# Patient Record
Sex: Female | Born: 1975 | Race: White | Hispanic: No | Marital: Single | Smoking: Former smoker
Health system: Southern US, Community
[De-identification: ages and names within clinical notes are randomized; demographics above are authoritative.]

## PROBLEM LIST (undated history)

## (undated) DIAGNOSIS — R000 Tachycardia, unspecified: Secondary | ICD-10-CM

## (undated) DIAGNOSIS — R0789 Other chest pain: Secondary | ICD-10-CM

## (undated) DIAGNOSIS — G43109 Migraine with aura, not intractable, without status migrainosus: Secondary | ICD-10-CM

## (undated) DIAGNOSIS — F419 Anxiety disorder, unspecified: Secondary | ICD-10-CM

## (undated) DIAGNOSIS — F4310 Post-traumatic stress disorder, unspecified: Secondary | ICD-10-CM

## (undated) DIAGNOSIS — G2581 Restless legs syndrome: Secondary | ICD-10-CM

## (undated) HISTORY — DX: Restless legs syndrome: G25.81

## (undated) HISTORY — DX: Migraine with aura, not intractable, without status migrainosus: G43.109

## (undated) HISTORY — DX: Post-traumatic stress disorder, unspecified: F43.10

## (undated) HISTORY — DX: Tachycardia, unspecified: R00.0

## (undated) HISTORY — DX: Anxiety disorder, unspecified: F41.9

## (undated) HISTORY — DX: Other chest pain: R07.89

---

## 2002-06-08 ENCOUNTER — Other Ambulatory Visit: Admission: RE | Admit: 2002-06-08 | Discharge: 2002-06-08 | Payer: Self-pay | Admitting: Obstetrics and Gynecology

## 2002-06-11 ENCOUNTER — Encounter: Payer: Self-pay | Admitting: Obstetrics and Gynecology

## 2002-06-11 ENCOUNTER — Ambulatory Visit (HOSPITAL_COMMUNITY): Admission: RE | Admit: 2002-06-11 | Discharge: 2002-06-11 | Payer: Self-pay | Admitting: Obstetrics and Gynecology

## 2003-06-17 ENCOUNTER — Other Ambulatory Visit: Admission: RE | Admit: 2003-06-17 | Discharge: 2003-06-17 | Payer: Self-pay | Admitting: Obstetrics and Gynecology

## 2004-11-22 ENCOUNTER — Other Ambulatory Visit: Admission: RE | Admit: 2004-11-22 | Discharge: 2004-11-22 | Payer: Self-pay | Admitting: Obstetrics and Gynecology

## 2005-05-17 ENCOUNTER — Ambulatory Visit: Payer: Self-pay

## 2006-08-14 DIAGNOSIS — F419 Anxiety disorder, unspecified: Secondary | ICD-10-CM | POA: Insufficient documentation

## 2009-07-10 DIAGNOSIS — G43109 Migraine with aura, not intractable, without status migrainosus: Secondary | ICD-10-CM | POA: Insufficient documentation

## 2012-05-20 ENCOUNTER — Observation Stay: Payer: Self-pay | Admitting: Surgery

## 2012-05-20 LAB — COMPREHENSIVE METABOLIC PANEL
Albumin: 3.5 g/dL (ref 3.4–5.0)
Chloride: 104 mmol/L (ref 98–107)
EGFR (Non-African Amer.): >60
Glucose: 113 mg/dL — ABNORMAL HIGH (ref 65–99)
Osmolality: 274 (ref 275–301)
Sodium: 136 mmol/L (ref 136–145)
Total Protein: 7.1 g/dL (ref 6.4–8.2)

## 2012-05-20 LAB — URINALYSIS, COMPLETE
Bacteria: NONE SEEN
Blood: NEGATIVE
Glucose,UR: NEGATIVE mg/dL (ref 0–75)
Leukocyte Esterase: NEGATIVE
Nitrite: NEGATIVE
Ph: 9 (ref 4.5–8.0)
Protein: NEGATIVE
Squamous Epithelial: 2
WBC UR: 1 /HPF (ref 0–5)

## 2012-05-20 LAB — CBC WITH DIFFERENTIAL/PLATELET
Basophil #: 0.1 10*3/uL (ref 0.0–0.1)
Basophil %: 0.3 %
Eosinophil %: 0.3 %
HCT: 35.7 % (ref 35.0–47.0)
Lymphocyte #: 1.6 10*3/uL (ref 1.0–3.6)
Lymphocyte %: 9.1 %
MCH: 32.7 pg (ref 26.0–34.0)
Monocyte #: 1.5 x10 3/mm — ABNORMAL HIGH (ref 0.2–0.9)
Monocyte %: 8.5 %
Neutrophil %: 81.8 %
Platelet: 231 10*3/uL (ref 150–440)
RDW: 12.5 % (ref 11.5–14.5)
WBC: 17.9 10*3/uL — ABNORMAL HIGH (ref 3.6–11.0)

## 2012-05-21 LAB — BASIC METABOLIC PANEL
Anion Gap: 6 — ABNORMAL LOW (ref 7–16)
Chloride: 107 mmol/L (ref 98–107)
Co2: 24 mmol/L (ref 21–32)
EGFR (African American): >60
Glucose: 97 mg/dL (ref 65–99)
Sodium: 137 mmol/L (ref 136–145)

## 2012-05-21 LAB — CBC WITH DIFFERENTIAL/PLATELET
Basophil #: 0 10*3/uL (ref 0.0–0.1)
Basophil %: 0.3 %
Eosinophil #: 0.1 10*3/uL (ref 0.0–0.7)
Eosinophil %: 1.1 %
HGB: 10.7 g/dL — ABNORMAL LOW (ref 12.0–16.0)
Lymphocyte #: 1.9 10*3/uL (ref 1.0–3.6)
MCHC: 34.9 g/dL (ref 32.0–36.0)
Monocyte #: 1.1 x10 3/mm — ABNORMAL HIGH (ref 0.2–0.9)
Neutrophil #: 7.9 10*3/uL — ABNORMAL HIGH (ref 1.4–6.5)
Platelet: 196 10*3/uL (ref 150–440)
WBC: 11.1 10*3/uL — ABNORMAL HIGH (ref 3.6–11.0)

## 2012-05-21 LAB — HEPATIC FUNCTION PANEL A (ARMC): Albumin: 2.9 g/dL — ABNORMAL LOW (ref 3.4–5.0)

## 2014-04-16 DIAGNOSIS — G2581 Restless legs syndrome: Secondary | ICD-10-CM | POA: Insufficient documentation

## 2014-04-16 DIAGNOSIS — F4310 Post-traumatic stress disorder, unspecified: Secondary | ICD-10-CM | POA: Insufficient documentation

## 2014-04-16 DIAGNOSIS — Z1331 Encounter for screening for depression: Secondary | ICD-10-CM | POA: Insufficient documentation

## 2014-05-06 NOTE — H&P (Signed)
PATIENT NAME:  Taylor Odom, Shaune L MR#:  [id number] DATE OF BIRTH:  1975/12/06  DATE OF ADMISSION:  05/20/2012  PRIMARY CARE PHYSICIAN: Dennison MascotLemont Morrisey, MD  GYNECOLOGIST: [place] in [place].   ADMITTING PHYSICIAN: Quentin Orealph L. Ely III, MD  CHIEF COMPLAINT: Abdominal pain.  BRIEF HISTORY: Taylor CoolerLisa Schwenke is a 39 year old woman seen in the Emergency Room with sudden onset of abdominal pain. The pain was both upper and lower abdomen, primarily in the midline. She awoke this morning and was very gassy and uncomfortable with bloating, went to work without eating, had significant flatus during the morning, ate lunch without difficulty but then developed severe midepigastric, suprapubic abdominal discomfort. The pain became so intense she could not tolerate it at home, presented to the Emergency Room for further intervention. She required significant doses of pain medicine in the Emergency Room to control her symptoms. Vital signs were unremarkable. Laboratory values demonstrated no significant change in liver function studies. Her bilirubin was 1.1. Lipase was normal. White blood cell count was 17,000. CT scan was performed which demonstrated slightly enlarged appendix with air in the base of the appendix, questionable right lower quadrant inflammatory changes and possible ovarian cysts bilaterally. Pelvic ultrasound was performed which demonstrated no evidence of any torsion, but 3 to 4 cm hemorrhagic-appearing cysts on both ovaries. There was some free fluid noted. Surgical service was consulted.   PAST MEDICAL HISTORY: She denies a history of hepatitis, yellow jaundice, pancreatitis, peptic ulcer disease or diverticulitis. She does have a history of being diagnosed with cholecystitis without cholelithiasis several years ago which resolved spontaneously. She has not had any intervention. She denies any previous GYN problems. She has an IUD in place for contraception. She does not have any abnormal bleeding. She has  not had any previous ovarian cyst problems. She [place]. She denies any previous abdominal surgery. She has no history of hypertension, cardiac disease, diabetes or thyroid disease.   CURRENT MEDICATIONS: Only medication at the present time is p.r.n. alprazolam.   ALLERGIES: She has no medical allergies.   SOCIAL HISTORY: She is not a cigarette smoker. Drinks alcohol only occasionally.   REVIEW OF SYSTEMS: Otherwise unremarkable.   PHYSICAL EXAMINATION: Her blood pressure is 128/78. Heart rate is 87 and regular. Pain scale was 10 on admission, is now 2 at the time of our evaluation. She is afebrile.  HEENT: Unremarkable. She has no scleral icterus. No pupillary abnormalities. No facial deformities.  NECK: Supple, nontender with midline trachea. No adenopathy.  CHEST: Clear. No adventitious sounds. Normal pulmonary excursion.  CARDIAC: No murmurs or gallops to my ear. She seems to be in normal sinus rhythm.  ABDOMEN: Her abdomen is generally soft with some mild suprapubic tenderness. She has no rebound, no guarding, no masses, no hernias. She has hypoactive but present bowel sounds.  EXTREMITIES: Lower extremity exam reveals full range of motion. No deformities. Good distal pulses.  PSYCHIATRIC: Normal orientation, normal affect.   IMAGING: I have independently reviewed her CT scan.   IMPRESSION AND PLAN: This presentation does not suggest appendicitis. Her appendix is minimally distended. There is no evidence of any significant periappendiceal fluid and nothing to suggest the severity of the symptoms that she presents with. I would be more concerned that she may have had a ruptured ovarian cyst with free fluid in the abdomen causing her significant chemical peritonitis. However, for pain control, we will admit her to the hospital and get gynecology to see her in  the morning. This plan has been discussed with the patient and her family. We will not start  antibiotics at this time.   ____________________________ Quentin Ore III, MD rle:jm D: 05/20/2012 21:05:24 ET T: 05/20/2012 21:38:18 ET JOB#: [id number]  cc: Quentin Ore III, MD, <Dictator> Dennison Mascot, MD Quentin Ore MD ELECTRONICALLY SIGNED 05/27/2012 10:48

## 2014-07-06 ENCOUNTER — Ambulatory Visit: Payer: Self-pay | Admitting: Family Medicine

## 2014-08-31 ENCOUNTER — Encounter: Payer: Self-pay | Admitting: Family Medicine

## 2014-08-31 ENCOUNTER — Ambulatory Visit (INDEPENDENT_AMBULATORY_CARE_PROVIDER_SITE_OTHER): Payer: 59 | Admitting: Family Medicine

## 2014-08-31 VITALS — BP 120/68 | HR 100 | Temp 98.0°F | Resp 16 | Ht 65.0 in | Wt 144.4 lb

## 2014-08-31 DIAGNOSIS — F4310 Post-traumatic stress disorder, unspecified: Secondary | ICD-10-CM | POA: Diagnosis not present

## 2014-08-31 DIAGNOSIS — F411 Generalized anxiety disorder: Secondary | ICD-10-CM | POA: Diagnosis not present

## 2014-08-31 MED ORDER — ALPRAZOLAM 0.5 MG PO TABS: 0.5000 mg | ORAL_TABLET | Freq: Two times a day (BID) | ORAL | Status: DC
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
## 2014-11-24 ENCOUNTER — Ambulatory Visit: Payer: 59 | Admitting: Family Medicine

## 2014-12-20 ENCOUNTER — Ambulatory Visit: Payer: 59 | Admitting: Family Medicine

## 2014-12-21 ENCOUNTER — Other Ambulatory Visit: Payer: Self-pay | Admitting: Family Medicine

## 2014-12-21 DIAGNOSIS — F411 Generalized anxiety disorder: Secondary | ICD-10-CM

## 2014-12-21 MED ORDER — ALPRAZOLAM 0.5 MG PO TABS
0.5000 mg | ORAL_TABLET | Freq: Two times a day (BID) | ORAL | Status: DC
Start: 2014-12-21 — End: 2014-12-28

## 2014-12-21 NOTE — Telephone Encounter (Signed)
Script called into [facility] for 1 month of Alprazolam. Script oked for refill by Dr. Carlynn PurlSowles.

## 2014-12-28 ENCOUNTER — Encounter: Payer: Self-pay | Admitting: Family Medicine

## 2014-12-28 ENCOUNTER — Ambulatory Visit (INDEPENDENT_AMBULATORY_CARE_PROVIDER_SITE_OTHER): Payer: 59 | Admitting: Family Medicine

## 2014-12-28 VITALS — BP 118/78 | HR 110 | Temp 98.8°F | Resp 20 | Ht 65.0 in | Wt 150.5 lb

## 2014-12-28 DIAGNOSIS — R635 Abnormal weight gain: Secondary | ICD-10-CM

## 2014-12-28 DIAGNOSIS — G2581 Restless legs syndrome: Secondary | ICD-10-CM | POA: Diagnosis not present

## 2014-12-28 DIAGNOSIS — F4310 Post-traumatic stress disorder, unspecified: Secondary | ICD-10-CM

## 2014-12-28 DIAGNOSIS — F411 Generalized anxiety disorder: Secondary | ICD-10-CM

## 2014-12-28 MED ORDER — ALPRAZOLAM 0.5 MG PO TABS: 0.5000 mg | ORAL_TABLET | Freq: Two times a day (BID) | ORAL | Status: DC
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
## 2015-03-29 ENCOUNTER — Ambulatory Visit: Payer: 59 | Admitting: Family Medicine

## 2015-05-15 ENCOUNTER — Other Ambulatory Visit: Payer: Self-pay | Admitting: Emergency Medicine

## 2015-05-15 DIAGNOSIS — F411 Generalized anxiety disorder: Secondary | ICD-10-CM

## 2015-05-15 MED ORDER — ALPRAZOLAM 0.5 MG PO TABS: 0.5000 mg | ORAL_TABLET | Freq: Two times a day (BID) | ORAL | Status: DC

## 2015-05-18 ENCOUNTER — Ambulatory Visit: Payer: 59 | Admitting: Family Medicine

## 2015-05-24 ENCOUNTER — Encounter: Payer: Self-pay | Admitting: Nurse Practitioner

## 2015-05-24 ENCOUNTER — Ambulatory Visit (INDEPENDENT_AMBULATORY_CARE_PROVIDER_SITE_OTHER): Payer: 59 | Admitting: Nurse Practitioner

## 2015-05-24 VITALS — BP 122/68 | HR 106 | Ht 64.0 in | Wt 135.0 lb

## 2015-05-24 DIAGNOSIS — R0789 Other chest pain: Secondary | ICD-10-CM | POA: Diagnosis not present

## 2015-05-24 DIAGNOSIS — R000 Tachycardia, unspecified: Secondary | ICD-10-CM | POA: Diagnosis not present

## 2015-05-24 DIAGNOSIS — R079 Chest pain, unspecified: Secondary | ICD-10-CM | POA: Diagnosis not present
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
## 2015-05-24 NOTE — Patient Instructions (Signed)
Medication Instructions:   Your physician recommends that you continue on your current medications as directed. Please refer to the Current Medication list given to you today.    If you need a refill on your cardiac medications before your next appointment, please call your pharmacy.  Labwork:  NONE ORDER TODAY  .  Testing/Procedures:  Your physician has requested that you have an exercise tolerance test. For further information please visit [email or web address]. Please also follow instruction sheet, as given.     Follow-Up: .CONTACT AS NEEDED FOR  ANY CARDIAC RELATED SYMPTOMS     Any Other Special Instructions Will Be Listed Below (If Applicable).

## 2015-05-29 ENCOUNTER — Telehealth: Payer: Self-pay | Admitting: Nurse Practitioner

## 2015-05-29 ENCOUNTER — Encounter: Payer: Self-pay | Admitting: Nurse Practitioner

## 2015-05-29 NOTE — Telephone Encounter (Signed)
Pt c/o BP issue: STAT if pt c/o blurred vision, one-sided weakness or slurred speech  1. What are your last 5 BP readings? Now 120/70 has been in 140's /90's   2. Are you having any other symptoms (ex. Dizziness, headache, blurred vision, passed out)? No   3. What is your BP issue?   Patient says she had a echo at [facility] in 2004 that showed abnormality.  She will fax results to office.   Please call to discuss.

## 2015-05-29 NOTE — Telephone Encounter (Signed)
Spoke w/ pt.  Advised her of Chris's recommendation.  She is agreeable and appreciative of the call.

## 2015-05-29 NOTE — Telephone Encounter (Signed)
Please have her check BP once daily @ the same time of day and record.  Please bring list of BPs to ETT on the 22nd and we can review.  We will also be able to track her BP with activity that day.    Echo reviewed - separate result note sent previously.  Nicolasa Duckinghristopher Wenda Vanschaick, NP

## 2015-05-29 NOTE — Telephone Encounter (Signed)
Pt calling to let Thayer OhmChris know that her BP was good in the office, but elevated when she checks it.  She states that she forgot to mention during her visit that she had an ECHO in 2004, she is faxing over for Chris's review.

## 2015-06-05 ENCOUNTER — Ambulatory Visit (INDEPENDENT_AMBULATORY_CARE_PROVIDER_SITE_OTHER): Payer: 59

## 2015-06-05 DIAGNOSIS — R0789 Other chest pain: Secondary | ICD-10-CM | POA: Diagnosis not present

## 2015-06-07 LAB — EXERCISE TOLERANCE TEST
CHL CUP RESTING HR STRESS: 105 {beats}/min
CHL CUP STRESS STAGE 1 DBP: 103 mmHg
CHL CUP STRESS STAGE 1 HR: 108 {beats}/min
CHL CUP STRESS STAGE 1 SBP: 121 mmHg
CHL CUP STRESS STAGE 10 DBP: 88 mmHg
CHL CUP STRESS STAGE 10 HR: 155 {beats}/min
CHL CUP STRESS STAGE 10 SBP: 145 mmHg
CHL CUP STRESS STAGE 11 SPEED: 0 mph
CHL CUP STRESS STAGE 3 GRADE: 0 %
CHL CUP STRESS STAGE 3 HR: 121 {beats}/min
CHL CUP STRESS STAGE 3 SPEED: 0 mph
CHL CUP STRESS STAGE 5 HR: 121 {beats}/min
CHL CUP STRESS STAGE 5 SPEED: 1 mph
CHL CUP STRESS STAGE 6 DBP: 98 mmHg
CHL CUP STRESS STAGE 6 GRADE: 10 %
CHL CUP STRESS STAGE 7 SBP: 136 mmHg
CHL CUP STRESS STAGE 7 SPEED: 2.5 mph
CHL CUP STRESS STAGE 8 GRADE: 14 %
CHL CUP STRESS STAGE 8 HR: 176 {beats}/min
CHL CUP STRESS STAGE 8 SPEED: 3.4 mph
CHL CUP STRESS STAGE 9 GRADE: 16 %
CHL CUP STRESS STAGE 9 HR: 184 {beats}/min
CHL CUP STRESS STAGE 9 SPEED: 4.2 mph
CHL RATE OF PERCEIVED EXERTION: 16
CSEPED: 9 min
CSEPPMHR: 101 %
Estimated workload: 10.9 METS
Exercise duration (sec): 30 s
MPHR: 181 {beats}/min
Peak HR: 184 {beats}/min
Percent HR: 101 %
Stage 1 Grade: 0 %
Stage 1 Speed: 0 mph
Stage 10 Grade: 0 %
Stage 10 Speed: 0 mph
Stage 11 DBP: 84 mmHg
Stage 11 Grade: 0 %
Stage 11 HR: 109 {beats}/min
Stage 11 SBP: 115 mmHg
Stage 2 Grade: 0 %
Stage 2 HR: 113 {beats}/min
Stage 2 Speed: 0 mph
Stage 4 Grade: 0 %
Stage 4 HR: 118 {beats}/min
Stage 4 Speed: 1 mph
Stage 5 Grade: 0 %
Stage 6 HR: 141 {beats}/min
Stage 6 SBP: 127 mmHg
Stage 6 Speed: 1.7 mph
Stage 7 DBP: 89 mmHg
Stage 7 Grade: 12 %
Stage 7 HR: 160 {beats}/min
Stage 8 DBP: 90 mmHg
Stage 8 SBP: 145 mmHg

## 2015-06-09 NOTE — Telephone Encounter (Signed)
Reviewed pt's stress test results w/ her.  She verbalizes understanding, but states that she wants to know what is going to be done about her BP. Reports her DBP has remained in the 90s. She states that she feels like she is "getting the run around". Inquired if she had been logging her BPs as instructed during our last conversation. She states that no one told her to bring her readings and that no one called her.  Advised her that I spoke w/ her myself, but she states that I did not call her.  Asked her to fax over her BP readings for Thayer OhmChris to review and that I will call her w/ his recommendations.

## 2015-06-09 NOTE — Telephone Encounter (Addendum)
5/15: 8:30 140/92 5:30 109/83 5/16: 8:30 110/70 5:30 104/80, 95 5/17: 8:30 120/78, 99 5:30 108/79, 87 5/18: 8:30 110/73 5:30 107/88, 103 5/19: 8:30 110/82, 124 5:30 111/81, 95 5/20:  9:30 108/70 5:00 116/89, 119 5/21: 2:00 105/84, 125 5/25: 5:30 110/90, 97 

## 2015-06-09 NOTE — Telephone Encounter (Signed)
Per Ward Givenshris  Berge, NP:  Occasional readings w/ isolated diastolic hypertension & normal systolic pressures.  I would not treat/add an antihypertensive for this finding, as there is not strong data for this & some data to show that isolated diastolic HTN is young individuals presents similar outcomes as a normotensive pt.  She should instead focus on lifestyle modifications, i.e., cutting out salt.  Of note, BP response to exercise during stress test was normal.  Spoke w/ pt and advised her of Chris's recommendation. Advised that if we treat her occasional high DBP, we can bottom her pressures out.  She verbalizes understanding and is agreeable.  Pt voices concern about possible repeat ECHO.  Reviewed this w/ her in detail. Pt has considerable anxiety, as her cousin recently passed away suddenly and she states that "everyone is so nonchalant about it".  She reports that her "heart races" but her HR will be WNL. Pt is current smoker, but does not feel that this is contributing to her sx.

## 2015-06-28 ENCOUNTER — Encounter: Payer: Self-pay | Admitting: Family Medicine

## 2015-06-28 ENCOUNTER — Ambulatory Visit (INDEPENDENT_AMBULATORY_CARE_PROVIDER_SITE_OTHER): Payer: 59 | Admitting: Family Medicine

## 2015-06-28 VITALS — BP 120/80 | HR 138 | Temp 98.7°F | Resp 16 | Ht 64.0 in | Wt 138.5 lb

## 2015-06-28 DIAGNOSIS — F411 Generalized anxiety disorder: Secondary | ICD-10-CM

## 2015-06-28 DIAGNOSIS — R000 Tachycardia, unspecified: Secondary | ICD-10-CM

## 2015-06-28 MED ORDER — ALPRAZOLAM 0.5 MG PO TABS: 0.5000 mg | ORAL_TABLET | Freq: Two times a day (BID) | ORAL | Status: DC

## 2015-06-28 NOTE — Progress Notes (Signed)
Name: Taylor MoseLisa L Odom   MRN: [id number]    DOB: 03/01/1975   Date:06/28/2015       Progress Note  Subjective  Chief Complaint  Chief Complaint  Patient presents with  . Anxiety    medication refill  This patient is usually followed by Dr. Thana AtesMorrisey, new to me.  Anxiety Presents for initial visit. Episode onset: over 16-17 years ago. The problem has been unchanged. Symptoms include irritability, nervous/anxious behavior and palpitations. Patient reports no chest pain, depressed mood or shortness of breath. The symptoms are aggravated by work stress. The quality of sleep is good.   Her past medical history is significant for anxiety/panic attacks and depression (Has been diagnosed with PTSD). Past treatments include benzodiazephines.     Past Medical History  Diagnosis Date  . Anxiety   . Migraine aura without headache   . Restless leg   . PTSD (post-traumatic stress disorder)   . Sinus tachycardia (HCC)     a. Says she is frequently told that her HR is elevated.  . Left chest pressure     a. Intermittent throughout her life-->increased freq and duration beginning 01/2015.    History reviewed. No pertinent past surgical history.  Family History  Problem Relation Age of Onset  . Hypertension Father     alive @ 6367  . COPD Father   . Hypothyroidism Mother     alive & well.  Marland Kitchen. Heart attack Cousin     died 04/2015 @ age 40.    Social History   Social History  . Marital Status: Single    Spouse Name: N/A  . Number of Children: N/A  . Years of Education: N/A   Occupational History  . 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 Smoking status: Current Some Day Smoker    Types: Cigarettes  . Smokeless tobacco: Never Used     Comment: She has been smoking about 5 cigarettes/wk more or less since her teens, having quit for a period of time in her early 7930's but later resuming.  . Alcohol Use: 0.0 oz/week    0 Standard drinks or equivalent per week     Comment: about 5  drinks/week, usually on the weekends.  . Drug Use: No  . Sexual Activity: Not on file   Other Topics Concern  . Not on file   Social History Narrative   Lives in [place].  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  Walks for exercise most days of the week.     Current outpatient prescriptions:  .  ALPRAZolam (XANAX) 0.5 MG tablet, Take 1 tablet (0.5 mg total) by mouth 2 (two) times daily., Disp: 60 tablet, Rfl: 0 .  minocycline (MINOCIN,DYNACIN) 100 MG capsule, Take 100 mg by mouth 2 (two) times daily., Disp: , Rfl: 0  Allergies  Allergen Reactions  . Amoxicillin-Pot Clavulanate      Review of Systems  Constitutional: Positive for irritability.  Respiratory: Negative for shortness of breath.   Cardiovascular: Positive for palpitations. Negative for chest pain.  Psychiatric/Behavioral: The patient is nervous/anxious.     Objective  Filed Vitals:   06/28/15 1008  BP: 120/80  Pulse: 138  Temp: 98.7 F (37.1 C)  TempSrc: Oral  Resp: 16  Height: 5\' 4"  (1.626 m)  Weight: 138 lb 8 oz (62.823 kg)  SpO2: 98%    Physical Exam  Constitutional: She is well-developed, well-nourished, and in no distress.  HENT:  Head: Normocephalic and  atraumatic.  Cardiovascular: Regular rhythm, S1 normal, S2 normal and normal heart sounds.  Tachycardia present.   No murmur heard. Pulmonary/Chest: Breath sounds normal. She has no decreased breath sounds. She has no wheezes.  Psychiatric: Mood, memory, affect and judgment normal.  Nursing note and vitals reviewed.      Assessment & Plan  1. Anxiety state Previous records reviewed. Has history of anxiety, managed appropriately on alprazolam 0.5 mg taken twice daily as needed. Refills provided - ALPRAZolam (XANAX) 0.5 MG tablet; Take 1 tablet (0.5 mg total) by mouth 2 (two) times daily.  Dispense: 60 tablet; Refill: 2  2. Sinus tachycardia (HCC) Has had workup by cardiology and laboratory evaluation. Patient will fax [place] the lab  results.   Daven Pinckney Asad A[facility] 06/28/2015 10:12 AM

## 2015-06-30 ENCOUNTER — Telehealth: Payer: Self-pay | Admitting: Cardiovascular Disease

## 2015-06-30 NOTE — Telephone Encounter (Signed)
lmov for patient to make an appointment with Dr Mariah MillingGollan

## 2015-08-04 ENCOUNTER — Encounter: Payer: Self-pay | Admitting: Cardiovascular Disease

## 2015-08-04 ENCOUNTER — Ambulatory Visit (INDEPENDENT_AMBULATORY_CARE_PROVIDER_SITE_OTHER): Payer: 59 | Admitting: Cardiovascular Disease

## 2015-08-04 VITALS — BP 120/80 | HR 100 | Ht 64.0 in | Wt 141.5 lb

## 2015-08-04 DIAGNOSIS — R0789 Other chest pain: Secondary | ICD-10-CM | POA: Diagnosis not present

## 2015-08-04 DIAGNOSIS — R000 Tachycardia, unspecified: Secondary | ICD-10-CM | POA: Diagnosis not present

## 2015-08-04 NOTE — Patient Instructions (Signed)
Medication Instructions:   Consider trying bystolic 5 mg once a day for tachycardia  Labwork:  No new labs  Testing/Procedures:  Options include: CT coronary calcium score, [place] $150  looks for blockage 30 day monitor if bubble gets worse Try the phone app (search for pulse meter, instant heart rate) Echo if worried about heart changes from the tachycardia   Follow-Up: It was a pleasure seeing you in the office today. Please call us if you have new issues that need to be addressed before your next appt.  [phone]  Your physician wants you to follow-up in: prn   If you need a refill on your cardiac medications before your next appointment, please call your pharmacy.

## 2015-08-04 NOTE — Progress Notes (Addendum)
Patient ID: Taylor MoseLisa L Odom, female   DOB: 01/31/1975, 40 y.o.   MRN: [id number] Cardiology Office Note  Date:  08/04/2015   ID:  Taylor Odom, DOB 12/28/1975, MRN [id number]  PCP:  Dennison MascotLemont Morrisey, MD   Chief Complaint  Patient presents with  . other    Pt would like to address concerns no complaints today. Meds reviewed verbally with pt.    HPI:  Taylor Odom is a very pleasant 40 year old woman, who works in [employer], who presents for follow-up of her chest discomfort, tachycardia.  Previously seen in our office and completed a exercise treadmill study She had good exercise tolerance, achieved close to 11 METS, close to 10 minutes of exercise, target heart rate achieved with no significant EKG changes concerning for ischemia. Resting heart rate prior to the study was 100 bpm, resting heart rate after 3 minutes in recovery was 123 bpm. Appropriate heart rate response, peak heart rate 180.  In follow-up she is concerned about her resting tachycardia, typically 100 bpm, often up to 140 bpm with minimal exertion. Unclear if she is having symptomatic tachycardia. Recently bought a wrist monitor and has been closely monitoring her pulse. Heart rate does seem to recover relatively quickly after exertion. No regular exercise program She is concerned of the long-term consequences of sinus tachycardia  Also has had a sensation in her left chest for many years described as a "bubbling" that can last for short periods of time, presenting at rest and with exertion, seems to come and go, no regular pattern.  On her last clinic visit, this seemed to be getting worse. On today's visit, has not continued to escalate Symptoms will typically resolve on their own without intervention  She does have Xanax for anxiety but takes this sparingly, sometimes once a day, sometimes none depending on her work stress  Some sporadic smoking On further discussion of her family history, father is a smoker, has COPD a 6767, mother has  thyroid disease no coronary disease in the family, no siblings  EKG on today's visit shows sinus tachycardia with rate 100 bpm, no significant ST or T-wave changes   PMH:   has a past medical history of Anxiety; Migraine aura without headache; Restless leg; PTSD (post-traumatic stress disorder); Sinus tachycardia (HCC); and Left chest pressure.  PSH:   No past surgical history on file.  Current Outpatient Prescriptions  Medication Sig Dispense Refill  . ALPRAZolam (XANAX) 0.5 MG tablet Take 1 tablet (0.5 mg total) by mouth 2 (two) times daily. (Patient taking differently: Take 0.5 mg by mouth 2 (two) times daily as needed. ) 60 tablet 2  . minocycline (MINOCIN,DYNACIN) 100 MG capsule Take 100 mg by mouth 2 (two) times daily as needed.   0   No current facility-administered medications for this visit.     Allergies:   Amoxicillin-pot clavulanate   Social History:  The patient  reports that she has been smoking Cigarettes.  She has never used smokeless tobacco. She reports that she drinks alcohol. She reports that she does not use illicit drugs.   Family History:   family history includes COPD in her father; Heart attack in her cousin; Hypertension in her father; Hypothyroidism in her mother.    Review of Systems: Review of Systems  Constitutional: Negative.   Respiratory: Negative.   Cardiovascular: Negative.        Occasional left chest fullness  Gastrointestinal: Negative.   Musculoskeletal: Negative.   Neurological: Negative.   Psychiatric/Behavioral: Negative.  All other systems reviewed and are negative.    PHYSICAL EXAM: VS:  BP 120/80 mmHg  Pulse 100  Ht  (1.626 m)  Wt 141 lb 8 oz (64.184 kg)  BMI 24.28 kg/m2 , BMI Body mass index is 24.28 kg/(m^2). GEN: Well nourished, well developed, in no acute distress HEENT: normal Neck: no JVD, carotid bruits, or masses Cardiac: RRR; no murmurs, rubs, or gallops,no edema , tachycardia Respiratory:  clear to  auscultation bilaterally, normal work of breathing GI: soft, nontender, nondistended, + BS MS: no deformity or atrophy Skin: warm and dry, no rash Neuro:  Strength and sensation are intact Psych: euthymic mood, full affect    Recent Labs: No results found for requested labs within last 365 days.    Lipid Panel No results found for: CHOL, HDL, LDLCALC, TRIG    Wt Readings from Last 3 Encounters:  08/04/15 141 lb 8 oz (64.184 kg)  06/28/15 138 lb 8 oz (62.823 kg)  05/24/15 135 lb (61.236 kg)       ASSESSMENT AND PLAN:  Sinus tachycardia (HCC) - Plan: EKG 12-Lead Long discussion concerning sinus tachycardia and long-term ramifications She does have worsening tachycardia with minimal exertion per her measurements using wrist monitor Previous echocardiogram several years ago did not demonstrate any structural heart disease, clinically appears to be euvolemic and doing well Treatment of the tachycardia will be really up to her and how she feels as currently there does not appear to be any risk with careful monitoring. -We did suggest she could try bystolic either on an as-needed basis or perhaps 5 mg daily for rate control She is interested in trying this on a daily basis to see how she feels We did also discussed potentially using propranolol on an as-needed basis She will call for prescription if desired Other testing that could be done/offered today if needed would be Holter monitor or 30 day monitor  Chest pressure I suspect some of her chest pressure may actually be ectopy We did discuss Holter or 30 day monitor if symptoms get worse Echocardiogram could be done for dramatic worsening of symptoms Finally we did touch on CT coronary calcium scoring if there is concern of underlying atherosclerosis There is no strong family history, she reports cholesterol is relatively well-controlled, only risk factor is sporadic smoking   Disposition:   F/U  prn    Orders Placed This  Encounter  Procedures  . EKG 12-Lead     Total encounter time more than 25 minutes  Greater than 50% was spent in counseling and coordination of care with the patient   Signed, Dossie Arbour, M.D., Ph.D. 08/04/2015  [facility] [place], [place] [phone]

## 2015-09-28 ENCOUNTER — Ambulatory Visit: Payer: 59 | Admitting: Family Medicine

## 2015-10-18 ENCOUNTER — Ambulatory Visit: Payer: 59 | Admitting: Family Medicine

## 2015-10-24 ENCOUNTER — Encounter: Payer: Self-pay | Admitting: Family Medicine

## 2015-10-24 ENCOUNTER — Ambulatory Visit (INDEPENDENT_AMBULATORY_CARE_PROVIDER_SITE_OTHER): Payer: 59 | Admitting: Family Medicine

## 2015-10-24 VITALS — BP 118/68 | HR 101 | Temp 98.3°F | Resp 16 | Ht 64.0 in | Wt 145.0 lb

## 2015-10-24 DIAGNOSIS — R000 Tachycardia, unspecified: Secondary | ICD-10-CM

## 2015-10-24 DIAGNOSIS — F410 Panic disorder [episodic paroxysmal anxiety] without agoraphobia: Secondary | ICD-10-CM

## 2015-10-24 MED ORDER — ALPRAZOLAM 0.5 MG PO TABS: 0.5000 mg | ORAL_TABLET | Freq: Two times a day (BID) | ORAL | 2 refills | Status: DC | PRN

## 2015-10-24 NOTE — Progress Notes (Signed)
Name: Taylor MoseLisa L Odom   MRN: [id number]    DOB: 11/19/1975   Date:10/24/2015       Progress Note  Subjective  Chief Complaint  Chief Complaint  Patient presents with  . Follow-up    3 mo  . Medication Refill    Xanax    Anxiety  Presents for initial visit. Episode onset: over 16-17 years ago. The problem has been unchanged. Symptoms include excessive worry, panic and shortness of breath. Patient reports no chest pain, depressed mood, irritability, nervous/anxious behavior or palpitations. The severity of symptoms is moderate and causing significant distress. The symptoms are aggravated by work stress. The quality of sleep is good.   Her past medical history is significant for anxiety/panic attacks and depression (Has been diagnosed with PTSD). Past treatments include benzodiazephines.     Past Medical History:  Diagnosis Date  . Anxiety   . Left chest pressure    a. Intermittent throughout her life-->increased freq and duration beginning 01/2015.  . Migraine aura without headache   . PTSD (post-traumatic stress disorder)   . Restless leg   . Sinus tachycardia    a. Says she is frequently told that her HR is elevated.    History reviewed. No pertinent surgical history.  Family History  Problem Relation Age of Onset  . Hypertension Father     alive @ 7867  . COPD Father   . Hypothyroidism Mother     alive & well.  Marland Kitchen. Heart attack Cousin     died 04/2015 @ age 40.    Social History   Social History  . Marital status: Single    Spouse name: N/A  . Number of children: N/A  . Years of education: N/A   Occupational History  . 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 Smoking status: Current Some Day Smoker    Types: Cigarettes  . Smokeless tobacco: Never Used     Comment: She has been smoking about 5 cigarettes/wk more or less since her teens, having quit for a period of time in her early 3430's but later resuming.  . Alcohol use 0.0 oz/week     Comment: about 5  drinks/week, usually on the weekends.  . Drug use: No  . Sexual activity: Not on file   Other Topics Concern  . Not on file   Social History Narrative   Lives in [place].  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  Walks for exercise most days of the week.     Current Outpatient Prescriptions:  .  ALPRAZolam (XANAX) 0.5 MG tablet, Take 1 tablet (0.5 mg total) by mouth 2 (two) times daily. (Patient taking differently: Take 0.5 mg by mouth 2 (two) times daily as needed. ), Disp: 60 tablet, Rfl: 2 .  minocycline (MINOCIN,DYNACIN) 100 MG capsule, Take 100 mg by mouth 2 (two) times daily as needed. , Disp: , Rfl: 0  Allergies  Allergen Reactions  . Amoxicillin-Pot Clavulanate      Review of Systems  Constitutional: Negative for irritability.  Respiratory: Positive for shortness of breath.   Cardiovascular: Negative for chest pain and palpitations.  Psychiatric/Behavioral: The patient is not nervous/anxious.     Objective  Vitals:   10/24/15 0846  BP: 118/68  Pulse: (!) 101  Resp: 16  Temp: 98.3 F (36.8 C)  TempSrc: Oral  SpO2: 98%  Weight: 145 lb (65.8 kg)  Height: 5\' 4"  (1.626 m)    Physical Exam  Constitutional:  She is oriented to person, place, and time and well-developed, well-nourished, and in no distress.  HENT:  Head: Normocephalic and atraumatic.  Cardiovascular: Regular rhythm and normal heart sounds.  Tachycardia present.   No murmur heard. Pulmonary/Chest: Effort normal and breath sounds normal. She has no wheezes.  Neurological: She is alert and oriented to person, place, and time.  Psychiatric: Mood, memory, affect and judgment normal.  Nursing note and vitals reviewed.      Assessment & Plan  1. Panic disorder without agoraphobia Stable, taking alprazolam daily as needed, refills provided, follow-up in 3 months - ALPRAZolam (XANAX) 0.5 MG tablet; Take 1 tablet (0.5 mg total) by mouth 2 (two) times daily as needed.  Dispense: 60 tablet; Refill:  2  2. Tachycardia Being followed by cardiology  Kathi Ludwig Asad [facility] 10/24/2015 8:58 AM

## 2016-01-25 ENCOUNTER — Ambulatory Visit: Payer: 59 | Admitting: Family Medicine

## 2016-02-20 ENCOUNTER — Ambulatory Visit: Payer: 59 | Admitting: Family Medicine

## 2016-02-27 ENCOUNTER — Encounter: Payer: Self-pay | Admitting: Family Medicine

## 2016-02-27 ENCOUNTER — Ambulatory Visit (INDEPENDENT_AMBULATORY_CARE_PROVIDER_SITE_OTHER): Payer: 59 | Admitting: Family Medicine

## 2016-02-27 DIAGNOSIS — F410 Panic disorder [episodic paroxysmal anxiety] without agoraphobia: Secondary | ICD-10-CM

## 2016-02-27 MED ORDER — ALPRAZOLAM 0.5 MG PO TABS: 0.5000 mg | ORAL_TABLET | Freq: Two times a day (BID) | ORAL | 2 refills | Status: DC | PRN

## 2016-02-27 NOTE — Progress Notes (Signed)
Name: Taylor Odom   MRN: [id number]    DOB: 1975/10/06   Date:02/27/2016       Progress Note  Subjective  Chief Complaint  Chief Complaint  Patient presents with  . Medication Refill    Anxiety  Presents for follow-up visit. Episode onset: over 16-17 years ago. The problem has been unchanged. Symptoms include excessive worry, nervous/anxious behavior, panic and shortness of breath. Patient reports no depressed mood, irritability or palpitations. The severity of symptoms is moderate and causing significant distress. The symptoms are aggravated by work stress. The quality of sleep is good.   Her past medical history is significant for anxiety/panic attacks and depression (Has been diagnosed with PTSD). Past treatments include benzodiazephines. Compliance with prior treatments has been good.    Past Medical History:  Diagnosis Date  . Anxiety   . Left chest pressure    a. Intermittent throughout her life-->increased freq and duration beginning 01/2015.  . Migraine aura without headache   . PTSD (post-traumatic stress disorder)   . Restless leg   . Sinus tachycardia    a. Says she is frequently told that her HR is elevated.    No past surgical history on file.  Family History  Problem Relation Age of Onset  . Hypertension Father     alive @ 44  . COPD Father   . Hypothyroidism Mother     alive & well.  Marland Kitchen Heart attack Cousin     died 24-Apr-2015 @ age 75.    Social History   Social History  . Marital status: Single    Spouse name: N/A  . Number of children: N/A  . Years of education: N/A   Occupational History  . 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 Smoking status: Current Some Day Smoker    Types: Cigarettes  . Smokeless tobacco: Never Used     Comment: She has been smoking about 5 cigarettes/wk more or less since her teens, having quit for a period of time in her early 87's but later resuming.  . Alcohol use 0.0 oz/week     Comment: about 5 drinks/week,  usually on the weekends.  . Drug use: No  . Sexual activity: Not on file   Other Topics Concern  . Not on file   Social History Narrative   Lives in [place].  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  Walks for exercise most days of the week.     Current Outpatient Prescriptions:  .  ALPRAZolam (XANAX) 0.5 MG tablet, Take 1 tablet (0.5 mg total) by mouth 2 (two) times daily as needed., Disp: 60 tablet, Rfl: 2 .  minocycline (MINOCIN,DYNACIN) 100 MG capsule, Take 100 mg by mouth 2 (two) times daily as needed. , Disp: , Rfl: 0 .  amoxicillin-clavulanate (AUGMENTIN) 875-125 MG tablet, , Disp: , Rfl:  .  predniSONE (STERAPRED UNI-PAK 21 TAB) 10 MG (21) TBPK tablet, , Disp: , Rfl:   Allergies  Allergen Reactions  . Amoxicillin-Pot Clavulanate      Review of Systems  Constitutional: Negative for irritability.  Respiratory: Positive for shortness of breath.   Cardiovascular: Negative for palpitations.  Psychiatric/Behavioral: The patient is nervous/anxious.     Objective  Vitals:   02/27/16 0917  BP: 122/80  Pulse: (!) 110  Resp: 16  Temp: 98.2 F (36.8 C)  TempSrc: Oral  SpO2: 96%  Weight: 148 lb 14.4 oz (67.5 kg)  Height: 5\' 4"  (1.626 m)  Physical Exam  Constitutional: She is oriented to person, place, and time and well-developed, well-nourished, and in no distress.  HENT:  Head: Normocephalic and atraumatic.  Cardiovascular: Normal rate, regular rhythm and normal heart sounds.   No murmur heard. Pulmonary/Chest: Effort normal and breath sounds normal. She has no wheezes.  Neurological: She is alert and oriented to person, place, and time.  Psychiatric: Mood, memory, affect and judgment normal.  Nursing note and vitals reviewed.    Assessment & Plan  1. Panic disorder without agoraphobia Stable, responsive to Alprazolam as prescribed, no side effects. - ALPRAZolam (XANAX) 0.5 MG tablet; Take 1 tablet (0.5 mg total) by mouth 2 (two) times daily as needed.   Dispense: 60 tablet; Refill: 2   Fount Bahe Asad [facility] 02/27/2016 9:39 AM

## 2016-05-27 ENCOUNTER — Ambulatory Visit: Payer: 59 | Admitting: Family Medicine

## 2016-06-04 ENCOUNTER — Encounter: Payer: Self-pay | Admitting: Family Medicine

## 2016-06-04 ENCOUNTER — Ambulatory Visit (INDEPENDENT_AMBULATORY_CARE_PROVIDER_SITE_OTHER): Payer: 59 | Admitting: Family Medicine

## 2016-06-04 VITALS — BP 122/77 | HR 104 | Temp 98.3°F | Resp 17 | Ht 64.0 in | Wt 145.9 lb

## 2016-06-04 DIAGNOSIS — F419 Anxiety disorder, unspecified: Secondary | ICD-10-CM | POA: Diagnosis not present

## 2016-06-04 DIAGNOSIS — F410 Panic disorder [episodic paroxysmal anxiety] without agoraphobia: Secondary | ICD-10-CM

## 2016-06-04 MED ORDER — ALPRAZOLAM 0.5 MG PO TABS: 0.5000 mg | ORAL_TABLET | Freq: Two times a day (BID) | ORAL | 2 refills | Status: DC | PRN

## 2016-06-04 NOTE — Progress Notes (Signed)
Name: Taylor Odom   MRN: [id number]    DOB: 12/09/1975   Date:06/04/2016       Progress Note  Subjective  Chief Complaint  Chief Complaint  Patient presents with  . Follow-up    3 mo  . Medication Refill    Anxiety  Presents for follow-up visit. Symptoms include insomnia. Patient reports no depressed mood, excessive worry, nervous/anxious behavior or panic. Primary symptoms comment: worsening anxiety with her dad passed away of lung cncer, now dealing with his estate.. The severity of symptoms is moderate and causing significant distress. The quality of sleep is fair.      Past Medical History:  Diagnosis Date  . Anxiety   . Left chest pressure    a. Intermittent throughout her life-->increased freq and duration beginning 01/2015.  . Migraine aura without headache   . PTSD (post-traumatic stress disorder)   . Restless leg   . Sinus tachycardia    a. Says she is frequently told that her HR is elevated.    History reviewed. No pertinent surgical history.  Family History  Problem Relation Age of Onset  . Hypertension Father        alive @ 41  . COPD Father   . Hypothyroidism Mother        alive & well.  Marland Kitchen. Heart attack Cousin        died 04/2015 @ age 41.    Social History   Social History  . Marital status: Single    Spouse name: N/A  . Number of children: N/A  . Years of education: N/A   Occupational History  . 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 Smoking status: Current Some Day Smoker    Types: Cigarettes  . Smokeless tobacco: Never Used     Comment: She has been smoking about 5 cigarettes/wk more or less since her teens, having quit for a period of time in her early 4030's but later resuming.  . Alcohol use 0.0 oz/week     Comment: about 5 drinks/week, usually on the weekends.  . Drug use: No  . Sexual activity: Not on file   Other Topics Concern  . Not on file   Social History Narrative   Lives in [place].  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  Walks for exercise most days of the week.     Current Outpatient Prescriptions:  .  ALPRAZolam (XANAX) 0.5 MG tablet, Take 1 tablet (0.5 mg total) by mouth 2 (two) times daily as needed., Disp: 60 tablet, Rfl: 2 .  minocycline (MINOCIN,DYNACIN) 100 MG capsule, Take 100 mg by mouth 2 (two) times daily as needed. , Disp: , Rfl: 0  Allergies  Allergen Reactions  . Amoxicillin-Pot Clavulanate      Review of Systems  Psychiatric/Behavioral: Negative for depression. The patient has insomnia. The patient is not nervous/anxious.       Objective  Vitals:   06/04/16 0842  BP: 122/77  Pulse: (!) 104  Resp: 17  Temp: 98.3 F (36.8 C)  TempSrc: Oral  SpO2: 97%  Weight: 145 lb 14.4 oz (66.2 kg)  Height: 5\' 4"  (1.626 m)    Physical Exam  Constitutional: She is oriented to person, place, and time and well-developed, well-nourished, and in no distress.  HENT:  Head: Normocephalic and atraumatic.  Cardiovascular: Normal rate, regular rhythm and normal heart sounds.   No murmur heard. Pulmonary/Chest: Effort normal and breath sounds normal. She has  no wheezes.  Musculoskeletal: She exhibits no edema.  Neurological: She is alert and oriented to person, place, and time.  Psychiatric: Mood, memory, affect and judgment normal.  Nursing note and vitals reviewed.      Assessment & Plan  1. Panic disorder without agoraphobia Stable, continue alprazolam as prescribed, refills provided - ALPRAZolam (XANAX) 0.5 MG tablet; Take 1 tablet (0.5 mg total) by mouth 2 (two) times daily as needed.  Dispense: 60 tablet; Refill: 2  2. Anxiety Now significantly worse after her father passed away and she is involved in the affairs of his estate, continue to monitor, denies any symptoms of depression. Reassess in 3 months   Kivon Aprea Asad A[facility] 06/04/2016 8:46 AM

## 2016-09-04 ENCOUNTER — Ambulatory Visit: Payer: 59 | Admitting: Family Medicine

## 2016-09-19 ENCOUNTER — Ambulatory Visit (INDEPENDENT_AMBULATORY_CARE_PROVIDER_SITE_OTHER): Payer: 59 | Admitting: Family Medicine

## 2016-09-19 ENCOUNTER — Encounter: Payer: Self-pay | Admitting: Family Medicine

## 2016-09-19 VITALS — BP 122/68 | HR 120 | Temp 98.2°F | Resp 17 | Ht 64.0 in | Wt 149.7 lb

## 2016-09-19 DIAGNOSIS — R000 Tachycardia, unspecified: Secondary | ICD-10-CM

## 2016-09-19 DIAGNOSIS — F410 Panic disorder [episodic paroxysmal anxiety] without agoraphobia: Secondary | ICD-10-CM

## 2016-09-19 MED ORDER — ALPRAZOLAM 0.5 MG PO TABS: 0.5000 mg | ORAL_TABLET | Freq: Two times a day (BID) | ORAL | 2 refills | Status: DC | PRN

## 2016-09-19 NOTE — Progress Notes (Signed)
Name: Taylor Odom   MRN: [id number]    DOB: 11-Jan-1976   Date:09/19/2016       Progress Note  Subjective  Chief Complaint  Chief Complaint  Patient presents with  . Follow-up    3 mo  . Medication Refill  . Anxiety    Anxiety  Presents for follow-up visit. Symptoms include irritability (still dealing with her dad's estate paperwork) and nervous/anxious behavior. Patient reports no depressed mood, excessive worry, insomnia or panic. The severity of symptoms is moderate and causing significant distress. The quality of sleep is fair.     Past Medical History:  Diagnosis Date  . Anxiety   . Left chest pressure    a. Intermittent throughout her life-->increased freq and duration beginning 01/2015.  . Migraine aura without headache   . PTSD (post-traumatic stress disorder)   . Restless leg   . Sinus tachycardia    a. Says she is frequently told that her HR is elevated.    History reviewed. No pertinent surgical history.  Family History  Problem Relation Age of Onset  . Hypertension Father        alive @ 9  . COPD Father   . Hypothyroidism Mother        alive & well.  Marland Kitchen Heart attack Cousin        died 28-May-2015 @ age 32.    Social History   Social History  . Marital status: Single    Spouse name: N/A  . Number of children: N/A  . Years of education: N/A   Occupational History  . 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 Smoking status: Current Some Day Smoker    Types: Cigarettes  . Smokeless tobacco: Never Used     Comment: She has been smoking about 5 cigarettes/wk more or less since her teens, having quit for a period of time in her early 18's but later resuming.  . Alcohol use 0.0 oz/week     Comment: about 5 drinks/week, usually on the weekends.  . Drug use: No  . Sexual activity: Not on file   Other Topics Concern  . Not on file   Social History Narrative   Lives in [place].  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  Walks for exercise most days  of the week.     Current Outpatient Prescriptions:  .  ALPRAZolam (XANAX) 0.5 MG tablet, Take 1 tablet (0.5 mg total) by mouth 2 (two) times daily as needed., Disp: 60 tablet, Rfl: 2 .  minocycline (MINOCIN,DYNACIN) 100 MG capsule, Take 100 mg by mouth 2 (two) times daily as needed. , Disp: , Rfl: 0  Allergies  Allergen Reactions  . Amoxicillin-Pot Clavulanate      Review of Systems  Constitutional: Positive for irritability (still dealing with her dad's estate paperwork).  Psychiatric/Behavioral: The patient is nervous/anxious. The patient does not have insomnia.      Objective  Vitals:   09/19/16 0901  BP: 122/68  Pulse: (!) 120  Resp: 17  Temp: 98.2 F (36.8 C)  TempSrc: Oral  SpO2: 99%  Weight: 149 lb 11.2 oz (67.9 kg)  Height:  (1.626 m)    Physical Exam  Constitutional: She is oriented to person, place, and time and well-developed, well-nourished, and in no distress.  HENT:  Head: Normocephalic and atraumatic.  Cardiovascular: Regular rhythm, S1 normal, S2 normal and normal heart sounds.  Tachycardia present.   No murmur heard. Pulmonary/Chest: Effort  normal and breath sounds normal. She has no wheezes.  Musculoskeletal: She exhibits no edema.  Neurological: She is alert and oriented to person, place, and time.  Psychiatric: Mood, memory, affect and judgment normal.  Nursing note and vitals reviewed.     Assessment & Plan  1. Panic disorder without agoraphobia Symptoms stable and responsive to alprazolam taken twice a day when necessary, patient compliant with controlled substances agreement. Refills provided - ALPRAZolam (XANAX) 0.5 MG tablet; Take 1 tablet (0.5 mg total) by mouth 2 (two) times daily as needed.  Dispense: 60 tablet; Refill: 2  2. Tachycardia Likely secondary to anxiety, otherwise asymptomatic. Consider repeating measurements at next visit  Syed Asad A. Faylene KurtzShah [facility] 09/19/2016 9:09  AM

## 2016-12-19 ENCOUNTER — Ambulatory Visit: Payer: 59 | Admitting: Family Medicine

## 2016-12-24 ENCOUNTER — Ambulatory Visit: Payer: 59 | Admitting: Family Medicine

## 2016-12-26 ENCOUNTER — Encounter: Payer: Self-pay | Admitting: Family Medicine

## 2016-12-26 ENCOUNTER — Ambulatory Visit (INDEPENDENT_AMBULATORY_CARE_PROVIDER_SITE_OTHER): Payer: 59 | Admitting: Family Medicine

## 2016-12-26 DIAGNOSIS — F410 Panic disorder [episodic paroxysmal anxiety] without agoraphobia: Secondary | ICD-10-CM

## 2016-12-26 MED ORDER — ALPRAZOLAM 0.5 MG PO TABS
0.5000 mg | ORAL_TABLET | Freq: Two times a day (BID) | ORAL | 2 refills | Status: AC | PRN
Start: 2016-12-26 — End: ?

## 2016-12-26 NOTE — Progress Notes (Signed)
Name: Taylor MoseLisa L Ferryman   MRN: [id number]    DOB: 04/27/1975   Date:12/26/2016       Progress Note  Subjective  Chief Complaint  Chief Complaint  Patient presents with  . Anxiety    F/u 3 month   . Medication Refill    Anxiety  Presents for follow-up visit. Symptoms include depressed mood, excessive worry and nervous/anxious behavior. Patient reports no insomnia, irritability, obsessions or panic. Primary symptoms comment: stressors include managing her father's estate, work stress. . The quality of sleep is good.       Past Medical History:  Diagnosis Date  . Anxiety   . Left chest pressure    a. Intermittent throughout her life-->increased freq and duration beginning 01/2015.  . Migraine aura without headache   . PTSD (post-traumatic stress disorder)   . Restless leg   . Sinus tachycardia    a. Says she is frequently told that her HR is elevated.    History reviewed. No pertinent surgical history.  Family History  Problem Relation Age of Onset  . Hypertension Father        alive @ 41  . COPD Father   . Hypothyroidism Mother        alive & well.  Marland Kitchen. Heart attack Cousin        died 04/2015 @ age 41.    Social History   Socioeconomic History  . Marital status: Single    Spouse name: Not on file  . Number of children: Not on file  . Years of education: Not on file  . Highest education level: Not on file  Social Needs  . Financial resource strain: Not on file  . Food insecurity - worry: Not on file  . Food insecurity - inability: Not on file  . Transportation needs - medical: Not on file  . Transportation needs - non-medical: Not on file  Occupational History  . 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 Smoking status: Former Smoker    Types: Cigarettes  . Smokeless tobacco: Never Used  . Tobacco comment: She has been smoking about 5 cigarettes/wk more or less since her teens, having quit for a period of time in her early 6630's but later resuming.  Substance and  Sexual Activity  . Alcohol use: Yes    Alcohol/week: 0.0 oz    Comment: about 5 drinks/week, usually on the weekends.  . Drug use: No  . Sexual activity: Yes    Partners: Male  Other Topics Concern  . Not on file  Social History Narrative   [place] in [place].  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  Walks for exercise most days of the week.     Current Outpatient Medications:  .  ALPRAZolam (XANAX) 0.5 MG tablet, Take 1 tablet (0.5 mg total) by mouth 2 (two) times daily as needed., Disp: 60 tablet, Rfl: 2 .  minocycline (MINOCIN,DYNACIN) 100 MG capsule, Take 100 mg by mouth 2 (two) times daily as needed. , Disp: , Rfl: 0  Allergies  Allergen Reactions  . Amoxicillin-Pot Clavulanate      Review of Systems  Constitutional: Negative for irritability.  Psychiatric/Behavioral: The patient is nervous/anxious. The patient does not have insomnia.      Objective  Vitals:   12/26/16 0956  BP: 112/76  Pulse: 97  Resp: 16  Temp: 98.4 F (36.9 C)  TempSrc: Oral  SpO2: 99%  Weight: 159 lb 14.4 oz (72.5 kg)  Height: 5\' 5"  (  1.651 m)    Physical Exam  Constitutional: She is well-developed, well-nourished, and in no distress.  Cardiovascular: Regular rhythm, S1 normal, S2 normal and normal heart sounds. Tachycardia present.  No murmur heard. Pulmonary/Chest: Effort normal and breath sounds normal.  Psychiatric: Mood, memory, affect and judgment normal.  Nursing note and vitals reviewed.       Assessment & Plan  1. Panic disorder without agoraphobia Patient takes alprazolam 0.5 mg twice a day when necessary for symptoms of anxiety and stress, seems to be working well, no reported adverse effects, compliant with controlled substances agreement, refills provided. Advised of my impending departure from the practice and the need to find a new provider for management of anxiety. - ALPRAZolam (XANAX) 0.5 MG tablet; Take 1 tablet (0.5 mg total) by mouth 2 (two) times daily as  needed.  Dispense: 60 tablet; Refill: 2   Danasha Melman Asad A. Faylene KurtzShah [facility] 12/26/2016 10:13 AM

## 2017-03-27 ENCOUNTER — Ambulatory Visit: Payer: 59 | Admitting: Family Medicine

## 2017-06-13 ENCOUNTER — Ambulatory Visit: Payer: 59 | Admitting: Family Medicine

## 2018-12-14 ENCOUNTER — Other Ambulatory Visit: Payer: Self-pay

## 2018-12-14 DIAGNOSIS — Z20822 Contact with and (suspected) exposure to covid-19: Secondary | ICD-10-CM

## 2018-12-17 LAB — NOVEL CORONAVIRUS, NAA: SARS-CoV-2, NAA: DETECTED — AB

## 2019-07-13 ENCOUNTER — Emergency Department
Admission: EM | Admit: 2019-07-13 | Discharge: 2019-07-14 | Disposition: A | Discharge status: Home / Self Care | Payer: BLUE CROSS/BLUE SHIELD | Attending: Emergency Medicine | Admitting: Emergency Medicine

## 2019-07-13 ENCOUNTER — Emergency Department: Payer: BLUE CROSS/BLUE SHIELD

## 2019-07-13 DIAGNOSIS — W19XXXA Unspecified fall, initial encounter: Secondary | ICD-10-CM | POA: Insufficient documentation

## 2019-07-13 DIAGNOSIS — S42252A Displaced fracture of greater tuberosity of left humerus, initial encounter for closed fracture: Secondary | ICD-10-CM | POA: Insufficient documentation

## 2019-07-13 DIAGNOSIS — Y998 Other external cause status: Secondary | ICD-10-CM | POA: Insufficient documentation

## 2019-07-13 DIAGNOSIS — Y92018 Other place in single-family (private) house as the place of occurrence of the external cause: Secondary | ICD-10-CM | POA: Diagnosis not present

## 2019-07-13 DIAGNOSIS — T07XXXA Unspecified multiple injuries, initial encounter: Secondary | ICD-10-CM

## 2019-07-13 DIAGNOSIS — Z87891 Personal history of nicotine dependence: Secondary | ICD-10-CM | POA: Insufficient documentation

## 2019-07-13 DIAGNOSIS — S80212A Abrasion, left knee, initial encounter: Secondary | ICD-10-CM | POA: Insufficient documentation

## 2019-07-13 DIAGNOSIS — S42202A Unspecified fracture of upper end of left humerus, initial encounter for closed fracture: Secondary | ICD-10-CM | POA: Diagnosis not present

## 2019-07-13 DIAGNOSIS — S4992XA Unspecified injury of left shoulder and upper arm, initial encounter: Secondary | ICD-10-CM | POA: Diagnosis present

## 2019-07-13 DIAGNOSIS — Z20822 Contact with and (suspected) exposure to covid-19: Secondary | ICD-10-CM | POA: Diagnosis not present

## 2019-07-13 DIAGNOSIS — Y9389 Activity, other specified: Secondary | ICD-10-CM | POA: Diagnosis not present

## 2019-07-13 MED ORDER — HYDROMORPHONE HCL 1 MG/ML IJ SOLN
1.0000 mg | INTRAMUSCULAR | Status: AC
  Administered 2019-07-13: 1 mg via INTRAVENOUS
  Filled 2019-07-13: qty 1

## 2019-07-13 NOTE — ED Triage Notes (Signed)
Pt reports fall after twisting ankle. Left shoulder pain and abrasion to left knee but pt reports the pain is not as severe. No loc or head injury.

## 2019-07-14 ENCOUNTER — Other Ambulatory Visit: Payer: Self-pay | Admitting: Surgery

## 2019-07-14 ENCOUNTER — Other Ambulatory Visit
Admission: RE | Admit: 2019-07-14 | Discharge: 2019-07-14 | Disposition: A | Discharge status: Home / Self Care | Payer: BLUE CROSS/BLUE SHIELD | Source: Ambulatory Visit | Attending: Surgery | Admitting: Surgery

## 2019-07-14 ENCOUNTER — Other Ambulatory Visit: Payer: Self-pay

## 2019-07-14 LAB — SARS CORONAVIRUS 2 (TAT 6-24 HRS): SARS Coronavirus 2: NEGATIVE

## 2019-07-14 MED ORDER — ONDANSETRON HCL 4 MG/2ML IJ SOLN
4.0000 mg | INTRAMUSCULAR | Status: AC
  Administered 2019-07-14: 4 mg via INTRAVENOUS
  Filled 2019-07-14: qty 2

## 2019-07-14 MED ORDER — OXYCODONE-ACETAMINOPHEN 5-325 MG PO TABS
2.0000 | ORAL_TABLET | Freq: Once | ORAL | Status: AC
  Administered 2019-07-14: 2 via ORAL
  Filled 2019-07-14: qty 2

## 2019-07-14 MED ORDER — ONDANSETRON 4 MG PO TBDP
ORAL_TABLET | ORAL | 0 refills | Status: DC
Start: 2019-07-14 — End: 2020-05-09

## 2019-07-14 MED ORDER — OXYCODONE-ACETAMINOPHEN 5-325 MG PO TABS: 2.0000 | ORAL_TABLET | Freq: Four times a day (QID) | ORAL | 0 refills | Status: DC | PRN

## 2019-07-14 NOTE — ED Provider Notes (Signed)
[facility] Emergency Department Provider Note  ____________________________________________   First MD Initiated Contact with Patient 07/13/19 2340     (approximate)  I have reviewed the triage vital signs and the nursing notes.   HISTORY  Chief Complaint Shoulder Pain    HPI Taylor Odom is a 44 y.o. female who presents for evaluation of acute onset and severe sharp stabbing pain in her left shoulder after a fall.  She reports that she was wearing flip-flops outside to bring in her trash toters when she  rolled her ankle and then fell to the ground on her left shoulder.  Her ankle feels fine and she has some abrasions to her knee but she is able to bear weight and flex and extend the knee, but the left upper arm and left shoulder is exceedingly painful.  Any amount of movement makes it much worse and nothing makes it better.  She has no numbness nor tingling in the hand or the arm and no swelling yet.  She did not strike her head, did not lose consciousness, and she denies neck pain and headache.        Past Medical History:  Diagnosis Date  . Anxiety   . Left chest pressure    a. Intermittent throughout her life-->increased freq and duration beginning 01/2015.  . Migraine aura without headache   . PTSD (post-traumatic stress disorder)   . Restless leg   . Sinus tachycardia    a. Says she is frequently told that her HR is elevated.    Patient Active Problem List   Diagnosis Date Noted  . Tachycardia 05/24/2015  . Chest pressure 05/24/2015  . Screening for depression 04/16/2014  . Posttraumatic stress disorder 04/16/2014  . Restless legs syndrome 04/16/2014  . Migraine with aura 07/10/2009  . Anxiety 08/14/2006    No past surgical history on file.  Prior to Admission medications   Medication Sig Start Date End Date Taking? Authorizing Provider  ALPRAZolam Prudy Feeler) 0.5 MG tablet Take 1 tablet (0.5 mg total) by mouth 2 (two) times daily as  needed. 12/26/16  Yes Ellyn Hack, MD  clobetasol ointment (TEMOVATE) 0.05 % Apply topically as needed. 06/23/19  Yes [provider]  ondansetron (ZOFRAN ODT) 4 MG disintegrating tablet Allow 1-2 tablets to dissolve in your mouth every 8 hours as needed for nausea/vomiting 07/14/19   Loleta Rose, MD  oxyCODONE-acetaminophen (PERCOCET) 5-325 MG tablet Take 2 tablets by mouth every 6 (six) hours as needed for severe pain. 07/14/19   Loleta Rose, MD    Allergies Amoxicillin-pot clavulanate  Family History  Problem Relation Age of Onset  . Hypertension Father        alive @ 24  . COPD Father   . Hypothyroidism Mother        alive & well.  Marland Kitchen Heart attack Cousin        died 05/14/2015 @ age 33.    Social History Social History   Tobacco Use  . Smoking status: Former Smoker    Types: Cigarettes  . Smokeless tobacco: Never Used  . Tobacco comment: She has been smoking about 5 cigarettes/wk more or less since her teens, having quit for a period of time in her early 67's but later resuming.  Substance Use Topics  . Alcohol use: Yes    Alcohol/week: 0.0 standard drinks    Comment: about 5 drinks/week, usually on the weekends.  . Drug use: No    Review  of Systems Constitutional: No fever/chills Eyes: No visual changes. ENT: No sore throat. Cardiovascular: Denies chest pain. Respiratory: Denies shortness of breath. Gastrointestinal: No abdominal pain.  No nausea, no vomiting.  No diarrhea.  No constipation. Genitourinary: Negative for dysuria. Musculoskeletal: Severe pain in left shoulder after a fall. Integumentary: Negative for rash. Neurological: Negative for headaches, focal weakness or numbness.   ____________________________________________   PHYSICAL EXAM:  VITAL SIGNS: ED Triage Vitals  Enc Vitals Group     BP 07/13/19 2339 97/85     Pulse Rate 07/13/19 2339 (!) 108     Resp 07/13/19 2339 16     Temp 07/13/19 2339 98.4 F (36.9 C)     Temp Source  07/13/19 2339 Oral     SpO2 07/13/19 2340 97 %     Weight 07/13/19 2337 80.7 kg (178 lb)     Height 07/13/19 2337 1.651 m (5\' 5" )     Head Circumference --      Peak Flow --      Pain Score 07/13/19 2350 10     Pain Loc --      Pain Edu? --      Excl. in GC? --     Constitutional: Alert and oriented.  Initially in severe distress from pain but after Dilaudid 1 mg IV she feels much better and is able to speak and provide a history. Eyes: Conjunctivae are normal.  Head: Atraumatic. Nose: No congestion/rhinnorhea. Mouth/Throat: Patient is wearing a mask. Neck: No stridor.  No meningeal signs.   Cardiovascular: Normal rate, regular rhythm. Good peripheral circulation. Grossly normal heart sounds. Respiratory: Normal respiratory effort.  No retractions. Gastrointestinal: Soft and nontender. No distention.  Musculoskeletal: Some swelling to the proximal humerus with severe tenderness to palpation with any amount of manipulation or movement of the arm and shoulder.  No obvious clavicular injury.  No injury to the elbow or more distal than the humerus.  The patient has some abrasions and contusion to the left knee but she is able to flex and extend it without any difficulty and there is no evidence of joint effusion.  Left ankle is normal in appearance and nontender with normal range of motion. Neurologic:  Normal speech and language. No gross focal neurologic deficits are appreciated.  Skin:  Skin is warm, dry and intact. Psychiatric: Mood and affect are normal. Speech and behavior are normal.  ____________________________________________   LABS (all labs ordered are listed, but only abnormal results are displayed)  Labs Reviewed - No data to display ____________________________________________  EKG  No indication for emergent EKG ____________________________________________  RADIOLOGY I, 07/15/19, personally viewed and evaluated these images (plain radiographs) as part of my  medical decision making, as well as reviewing the written report by the radiologist.  ED MD interpretation: Proximal left humeral shaft and greater tuberosity fractures.  Official radiology report(s): DG Shoulder Left  Result Date: 07/13/2019 CLINICAL DATA:  Fall, left shoulder pain EXAM: LEFT SHOULDER - 2+ VIEW COMPARISON:  None. FINDINGS: There is a fracture through the proximal shaft of the left humerus. Fracture also noted through the greater tuberosity. No subluxation or dislocation. IMPRESSION: Left proximal humeral shaft and greater tuberosity fractures. Electronically Signed   By: 07/15/2019 M.D.   On: 07/13/2019 23:36    ____________________________________________   PROCEDURES   Procedure(s) performed (including Critical Care):  Procedures   ____________________________________________   INITIAL IMPRESSION / MDM / ASSESSMENT AND PLAN / ED COURSE  As part of my medical  decision making, I reviewed the following data within the electronic MEDICAL RECORD NUMBER History obtained from family, Nursing notes reviewed and incorporated, Old chart reviewed, Radiograph reviewed , Discussed with orthopedic surgeon (Dr. Rosita Kea), Notes from prior ED visits and [place] Controlled Substance Database   Differential diagnosis includes, but is not limited to, fracture, dislocation, neurovascular injury.  The patient is neurovascularly intact but has obvious and substantial proximal humeral fractures.  I discussed the case by phone with Dr. Rosita Kea with orthopedic surgery.  He recommended a shoulder immobilizer in which I placed the patient and close follow-up in his clinic with one of his partners, either Dr. Joice Lofts or Dr. Allena Katz, within a week.  The patient's pain was much better controlled after Dilaudid 1 mg IV and I also provided Zofran 4 mg IV.  I also provided her with 2 Percocet prior to discharge and a prescription for Percocet and Zofran.  She and her mother, who is at bedside, understand the plan for  them to call orthopedics clinic in the morning and schedule follow-up appointment.  I also sent the patient's chart to Dr. Rosita Kea through [facility] so he would have it available.  I gave my usual and customary return precautions.           ____________________________________________  FINAL CLINICAL IMPRESSION(S) / ED DIAGNOSES  Final diagnoses:  Closed fracture of proximal end of left humerus, unspecified fracture morphology, initial encounter  Closed displaced fracture of greater tuberosity of left humerus, initial encounter  Fall, initial encounter  Multiple abrasions     MEDICATIONS GIVEN DURING THIS VISIT:  Medications  HYDROmorphone (DILAUDID) injection 1 mg (1 mg Intravenous Given 07/13/19 2358)  oxyCODONE-acetaminophen (PERCOCET/ROXICET) 5-325 MG per tablet 2 tablet (2 tablets Oral Given 07/14/19 0038)  ondansetron (ZOFRAN) injection 4 mg (4 mg Intravenous Given 07/14/19 0037)     ED Discharge Orders         Ordered    oxyCODONE-acetaminophen (PERCOCET) 5-325 MG tablet  Every 6 hours PRN     Discontinue  Reprint     07/14/19 0033    ondansetron (ZOFRAN ODT) 4 MG disintegrating tablet     Discontinue  Reprint     07/14/19 0033          *Please note:  Aaron Mose was evaluated in Emergency Department on 07/14/2019 for the symptoms described in the history of present illness. She was evaluated in the context of the global COVID-19 pandemic, which necessitated consideration that the patient might be at risk for infection with the SARS-CoV-2 virus that causes COVID-19. Institutional protocols and algorithms that pertain to the evaluation of patients at risk for COVID-19 are in a state of rapid change based on information released by regulatory bodies including the CDC and federal and state organizations. These policies and algorithms were followed during the patient's care in the ED.  Some ED evaluations and interventions may be delayed as a result of limited staffing during and after  the pandemic.*  Note:  This document was prepared using Dragon voice recognition software and may include unintentional dictation errors.   Loleta Rose, MD 07/14/19 [id number]

## 2019-07-14 NOTE — ED Notes (Signed)
NT's placing shoulder immobilizer now.

## 2019-07-14 NOTE — ED Notes (Signed)
Pt's mother signed for d/c as per pt's verbal okay.

## 2019-07-14 NOTE — ED Notes (Signed)
[facility] NT wheeling pt out to vehicle now.

## 2019-07-14 NOTE — Discharge Instructions (Signed)
As we discussed, you will need to follow-up with orthopedics for further management of your broken upper arm.  Please use the shoulder immobilizer as much as possible to minimize the amount the arm moves around.  Loosen the straps if they become tight due to swelling.  You may use cold packs to try to improve the pain and swelling.  Call the number provided for the orthopedics clinic in the morning and let them know that the emergency department physician spoke with Dr. Rosita Kea and that he ask you to call today to schedule a follow-up appointment either with Dr. Joice Lofts or Dr. Allena Katz for about 1 week from today.  You can use over-the-counter ibuprofen and Tylenol according to label instructions for pain.  Take Percocet as prescribed for severe pain. Do not drink alcohol, drive or participate in any other potentially dangerous activities while taking this medication as it may make you sleepy. Do not take this medication with any other sedating medications, either prescription or over-the-counter. If you were prescribed Percocet or Vicodin, do not take these with acetaminophen (Tylenol) as it is already contained within these medications.   This medication is an opiate (or narcotic) pain medication and can be habit forming.  Use it as little as possible to achieve adequate pain control.  Do not use or use it with extreme caution if you have a history of opiate abuse or dependence.  If you are on a pain contract with your primary care doctor or a pain specialist, be sure to let them know you were prescribed this medication today from the [facility] Emergency Department.  This medication is intended for your use only - do not give any to anyone else and keep it in a secure place where nobody else, especially children, have access to it.  It will also cause or worsen constipation, so you may want to consider taking an over-the-counter stool softener while you are taking this medication.    Return to the emergency  department if you develop new or worsening symptoms that concern you.

## 2019-07-14 NOTE — ED Notes (Signed)
Lav/lt grn/red tubes sent to lab. Visitor at bedside with pt. Given warm blanket. Abrasions to pt's knees.

## 2019-07-14 NOTE — ED Notes (Signed)
Pt A&Ox4. Denies hitting head. L shoulder pain. L radial pulse 2+. Hand warm.

## 2019-07-14 NOTE — ED Notes (Signed)
This RN at bedside to clean and dress abrasions. EDP York Cerise at bedside to update on plan.

## 2019-07-14 NOTE — ED Notes (Signed)
Pt denies nausea.

## 2019-07-15 ENCOUNTER — Encounter: Payer: Self-pay | Admitting: Surgery

## 2019-07-15 ENCOUNTER — Encounter
Admission: RE | Disposition: A | Discharge status: Home / Self Care | Payer: Self-pay | Source: Home / Self Care | Attending: Surgery

## 2019-07-15 ENCOUNTER — Other Ambulatory Visit: Payer: Self-pay

## 2019-07-15 ENCOUNTER — Ambulatory Visit: Payer: BLUE CROSS/BLUE SHIELD | Admitting: Anesthesiology

## 2019-07-15 ENCOUNTER — Ambulatory Visit: Payer: BLUE CROSS/BLUE SHIELD

## 2019-07-15 ENCOUNTER — Ambulatory Visit
Admission: RE | Admit: 2019-07-15 | Discharge: 2019-07-15 | Disposition: A | Discharge status: Home / Self Care | Payer: BLUE CROSS/BLUE SHIELD | Attending: Surgery | Admitting: Surgery

## 2019-07-15 DIAGNOSIS — W010XXA Fall on same level from slipping, tripping and stumbling without subsequent striking against object, initial encounter: Secondary | ICD-10-CM | POA: Insufficient documentation

## 2019-07-15 DIAGNOSIS — Z87891 Personal history of nicotine dependence: Secondary | ICD-10-CM | POA: Insufficient documentation

## 2019-07-15 DIAGNOSIS — Y93E9 Activity, other interior property and clothing maintenance: Secondary | ICD-10-CM | POA: Diagnosis not present

## 2019-07-15 DIAGNOSIS — F419 Anxiety disorder, unspecified: Secondary | ICD-10-CM | POA: Insufficient documentation

## 2019-07-15 DIAGNOSIS — S42302A Unspecified fracture of shaft of humerus, left arm, initial encounter for closed fracture: Secondary | ICD-10-CM | POA: Diagnosis present

## 2019-07-15 DIAGNOSIS — I10 Essential (primary) hypertension: Secondary | ICD-10-CM | POA: Diagnosis not present

## 2019-07-15 DIAGNOSIS — Z79899 Other long term (current) drug therapy: Secondary | ICD-10-CM | POA: Insufficient documentation

## 2019-07-15 DIAGNOSIS — Z419 Encounter for procedure for purposes other than remedying health state, unspecified: Secondary | ICD-10-CM

## 2019-07-15 HISTORY — PX: ORIF HUMERUS FRACTURE: SHX2126

## 2019-07-15 LAB — POCT PREGNANCY, URINE: Preg Test, Ur: NEGATIVE

## 2019-07-15 SURGERY — OPEN REDUCTION INTERNAL FIXATION (ORIF) PROXIMAL HUMERUS FRACTURE
Anesthesia: General | Site: Shoulder | Laterality: Left

## 2019-07-15 MED ORDER — BUPIVACAINE HCL (PF) 0.5 % IJ SOLN
INTRAMUSCULAR | Status: AC
  Filled 2019-07-15: qty 10

## 2019-07-15 MED ORDER — CLINDAMYCIN PHOSPHATE 900 MG/50ML IV SOLN
900.0000 mg | INTRAVENOUS | Status: AC
  Administered 2019-07-15: 900 mg via INTRAVENOUS

## 2019-07-15 MED ORDER — BUPIVACAINE LIPOSOME 1.3 % IJ SUSP
INTRAMUSCULAR | Status: AC
  Filled 2019-07-15: qty 20

## 2019-07-15 MED ORDER — OXYCODONE HCL 5 MG/5ML PO SOLN: 5.0000 mg | Freq: Once | ORAL | Status: AC | PRN

## 2019-07-15 MED ORDER — PROPOFOL 10 MG/ML IV BOLUS
INTRAVENOUS | Status: DC | PRN
  Administered 2019-07-15: 150 mg via INTRAVENOUS

## 2019-07-15 MED ORDER — LACTATED RINGERS IV SOLN: INTRAVENOUS | Status: DC

## 2019-07-15 MED ORDER — MIDAZOLAM HCL 2 MG/2ML IJ SOLN
INTRAMUSCULAR | Status: AC
  Administered 2019-07-15: 1 mg via INTRAVENOUS
  Filled 2019-07-15: qty 2

## 2019-07-15 MED ORDER — PHENYLEPHRINE HCL (PRESSORS) 10 MG/ML IV SOLN
INTRAVENOUS | Status: DC | PRN
  Administered 2019-07-15 (×2): 100 ug via INTRAVENOUS

## 2019-07-15 MED ORDER — OXYCODONE HCL 5 MG PO TABS: 5.0000 mg | ORAL_TABLET | ORAL | 0 refills | Status: AC | PRN

## 2019-07-15 MED ORDER — OXYCODONE HCL 5 MG PO TABS
ORAL_TABLET | ORAL | Status: AC
  Filled 2019-07-15: qty 1

## 2019-07-15 MED ORDER — SCOPOLAMINE 1 MG/3DAYS TD PT72
MEDICATED_PATCH | TRANSDERMAL | Status: AC
  Filled 2019-07-15: qty 1

## 2019-07-15 MED ORDER — ROCURONIUM BROMIDE 100 MG/10ML IV SOLN
INTRAVENOUS | Status: DC | PRN
  Administered 2019-07-15: 10 mg via INTRAVENOUS
  Administered 2019-07-15: 50 mg via INTRAVENOUS

## 2019-07-15 MED ORDER — METOCLOPRAMIDE HCL 5 MG/ML IJ SOLN: 5.0000 mg | Freq: Three times a day (TID) | INTRAMUSCULAR | Status: DC | PRN

## 2019-07-15 MED ORDER — MIDAZOLAM HCL 2 MG/2ML IJ SOLN
INTRAMUSCULAR | Status: AC
  Filled 2019-07-15: qty 2

## 2019-07-15 MED ORDER — FENTANYL CITRATE (PF) 100 MCG/2ML IJ SOLN
INTRAMUSCULAR | Status: AC
  Administered 2019-07-15: 50 ug via INTRAVENOUS
  Filled 2019-07-15: qty 2

## 2019-07-15 MED ORDER — SUGAMMADEX SODIUM 200 MG/2ML IV SOLN
INTRAVENOUS | Status: DC | PRN
  Administered 2019-07-15: 200 mg via INTRAVENOUS

## 2019-07-15 MED ORDER — OXYCODONE HCL 5 MG PO TABS
5.0000 mg | ORAL_TABLET | Freq: Once | ORAL | Status: AC
  Administered 2019-07-15: 5 mg via ORAL
  Filled 2019-07-15: qty 1

## 2019-07-15 MED ORDER — DEXMEDETOMIDINE HCL 200 MCG/2ML IV SOLN
INTRAVENOUS | Status: DC | PRN
  Administered 2019-07-15: 24 ug via INTRAVENOUS

## 2019-07-15 MED ORDER — BUPIVACAINE-EPINEPHRINE (PF) 0.5% -1:200000 IJ SOLN
INTRAMUSCULAR | Status: AC
  Filled 2019-07-15: qty 30

## 2019-07-15 MED ORDER — BUPIVACAINE LIPOSOME 1.3 % IJ SUSP
INTRAMUSCULAR | Status: DC | PRN
  Administered 2019-07-15 (×4): 5 mL

## 2019-07-15 MED ORDER — GLYCOPYRROLATE 0.2 MG/ML IJ SOLN
INTRAMUSCULAR | Status: DC | PRN
  Administered 2019-07-15: .2 mg via INTRAVENOUS

## 2019-07-15 MED ORDER — ONDANSETRON HCL 4 MG PO TABS: 4.0000 mg | ORAL_TABLET | Freq: Four times a day (QID) | ORAL | Status: DC | PRN

## 2019-07-15 MED ORDER — ONDANSETRON HCL 4 MG/2ML IJ SOLN
INTRAMUSCULAR | Status: DC | PRN
  Administered 2019-07-15: 4 mg via INTRAVENOUS

## 2019-07-15 MED ORDER — ACETAMINOPHEN 10 MG/ML IV SOLN
INTRAVENOUS | Status: DC | PRN
  Administered 2019-07-15: 1000 mg via INTRAVENOUS

## 2019-07-15 MED ORDER — FENTANYL CITRATE (PF) 100 MCG/2ML IJ SOLN
INTRAMUSCULAR | Status: AC
  Administered 2019-07-15: 25 ug via INTRAVENOUS
  Filled 2019-07-15: qty 2

## 2019-07-15 MED ORDER — LIDOCAINE HCL (CARDIAC) PF 100 MG/5ML IV SOSY
PREFILLED_SYRINGE | INTRAVENOUS | Status: DC | PRN
  Administered 2019-07-15: 80 mg via INTRAVENOUS

## 2019-07-15 MED ORDER — FENTANYL CITRATE (PF) 100 MCG/2ML IJ SOLN
50.0000 ug | Freq: Once | INTRAMUSCULAR | Status: AC
  Administered 2019-07-15: 50 ug via INTRAVENOUS

## 2019-07-15 MED ORDER — LIDOCAINE HCL (PF) 1 % IJ SOLN
INTRAMUSCULAR | Status: AC
  Filled 2019-07-15: qty 5

## 2019-07-15 MED ORDER — LACTATED RINGERS IV SOLN: INTRAVENOUS | Status: DC | PRN

## 2019-07-15 MED ORDER — MIDAZOLAM HCL 2 MG/2ML IJ SOLN: 1.0000 mg | Freq: Once | INTRAMUSCULAR | Status: AC

## 2019-07-15 MED ORDER — FAMOTIDINE 20 MG PO TABS
ORAL_TABLET | ORAL | Status: AC
  Filled 2019-07-15: qty 1

## 2019-07-15 MED ORDER — BUPIVACAINE-EPINEPHRINE (PF) 0.5% -1:200000 IJ SOLN
INTRAMUSCULAR | Status: DC | PRN
  Administered 2019-07-15: 30 mL

## 2019-07-15 MED ORDER — CHLORHEXIDINE GLUCONATE 0.12 % MT SOLN
OROMUCOSAL | Status: AC
  Filled 2019-07-15: qty 15

## 2019-07-15 MED ORDER — ORAL CARE MOUTH RINSE: 15.0000 mL | Freq: Once | OROMUCOSAL | Status: AC

## 2019-07-15 MED ORDER — OXYCODONE HCL 5 MG PO TABS: 5.0000 mg | ORAL_TABLET | ORAL | Status: DC | PRN

## 2019-07-15 MED ORDER — METOCLOPRAMIDE HCL 10 MG PO TABS: 5.0000 mg | ORAL_TABLET | Freq: Three times a day (TID) | ORAL | Status: DC | PRN

## 2019-07-15 MED ORDER — PROMETHAZINE HCL 25 MG/ML IJ SOLN: 6.2500 mg | INTRAMUSCULAR | Status: DC | PRN

## 2019-07-15 MED ORDER — FAMOTIDINE 20 MG PO TABS
20.0000 mg | ORAL_TABLET | Freq: Once | ORAL | Status: AC
  Administered 2019-07-15: 20 mg via ORAL

## 2019-07-15 MED ORDER — FENTANYL CITRATE (PF) 100 MCG/2ML IJ SOLN
INTRAMUSCULAR | Status: DC | PRN
  Administered 2019-07-15 (×4): 50 ug via INTRAVENOUS

## 2019-07-15 MED ORDER — CLINDAMYCIN PHOSPHATE 900 MG/50ML IV SOLN
INTRAVENOUS | Status: AC
  Filled 2019-07-15: qty 50

## 2019-07-15 MED ORDER — DEXAMETHASONE SODIUM PHOSPHATE 10 MG/ML IJ SOLN
INTRAMUSCULAR | Status: DC | PRN
  Administered 2019-07-15: 10 mg via INTRAVENOUS

## 2019-07-15 MED ORDER — OXYCODONE HCL 5 MG PO TABS
5.0000 mg | ORAL_TABLET | Freq: Once | ORAL | Status: AC | PRN
  Administered 2019-07-15: 5 mg via ORAL

## 2019-07-15 MED ORDER — FENTANYL CITRATE (PF) 100 MCG/2ML IJ SOLN
INTRAMUSCULAR | Status: AC
  Filled 2019-07-15: qty 2

## 2019-07-15 MED ORDER — SCOPOLAMINE 1 MG/3DAYS TD PT72
MEDICATED_PATCH | TRANSDERMAL | Status: DC | PRN
  Administered 2019-07-15: 1 via TRANSDERMAL

## 2019-07-15 MED ORDER — CHLORHEXIDINE GLUCONATE 0.12 % MT SOLN
15.0000 mL | Freq: Once | OROMUCOSAL | Status: AC
  Administered 2019-07-15: 15 mL via OROMUCOSAL

## 2019-07-15 MED ORDER — LIDOCAINE HCL (PF) 1 % IJ SOLN
INTRAMUSCULAR | Status: DC | PRN
  Administered 2019-07-15: 1 mL

## 2019-07-15 MED ORDER — SODIUM CHLORIDE 0.9 % IV SOLN
INTRAVENOUS | Status: DC | PRN
  Administered 2019-07-15: 50 ug/min via INTRAVENOUS

## 2019-07-15 MED ORDER — FENTANYL CITRATE (PF) 100 MCG/2ML IJ SOLN
25.0000 ug | INTRAMUSCULAR | Status: DC | PRN
  Administered 2019-07-15 (×4): 25 ug via INTRAVENOUS

## 2019-07-15 MED ORDER — ONDANSETRON HCL 4 MG/2ML IJ SOLN: 4.0000 mg | Freq: Four times a day (QID) | INTRAMUSCULAR | Status: DC | PRN

## 2019-07-15 SURGICAL SUPPLY — 65 items
2.5 GUIDE WIRE ×2 IMPLANT
3.3 LONG DRILL BIT ×2 IMPLANT
BIT DRILL CALIBRATED AFFXS 3.3 (DRILL) ×1 IMPLANT
BIT DRILL SURG TIB 3.3X152.5 (DRILL) ×1 IMPLANT
BNDG COHESIVE 4X5 TAN STRL (GAUZE/BANDAGES/DRESSINGS) ×2 IMPLANT
BNDG ELASTIC 4X5.8 VLCR STR LF (GAUZE/BANDAGES/DRESSINGS) IMPLANT
CANISTER SUCT 1200ML W/VALVE (MISCELLANEOUS) ×2 IMPLANT
CHLORAPREP W/TINT 26 (MISCELLANEOUS) ×4 IMPLANT
COVER WAND RF STERILE (DRAPES) ×2 IMPLANT
DRAPE C-ARM XRAY 36X54 (DRAPES) ×6 IMPLANT
DRAPE IMP U-DRAPE 54X76 (DRAPES) ×4 IMPLANT
DRAPE INCISE IOBAN 66X45 STRL (DRAPES) ×4 IMPLANT
DRAPE SHEET LG 3/4 BI-LAMINATE (DRAPES) ×2 IMPLANT
DRAPE U-SHAPE 47X51 STRL (DRAPES) ×2 IMPLANT
DRILL CALIBRATED AFFIXUS 3.3 (DRILL) ×2
DRILL SURG TIB 3.3X152.5 (DRILL) ×2
DRSG OPSITE POSTOP 3X4 (GAUZE/BANDAGES/DRESSINGS) ×2 IMPLANT
DRSG OPSITE POSTOP 4X10 (GAUZE/BANDAGES/DRESSINGS) IMPLANT
DRSG OPSITE POSTOP 4X6 (GAUZE/BANDAGES/DRESSINGS) ×2 IMPLANT
DRSG OPSITE POSTOP 4X8 (GAUZE/BANDAGES/DRESSINGS) IMPLANT
ELECT CAUTERY BLADE 6.4 (BLADE) ×2 IMPLANT
ELECT REM PT RETURN 9FT ADLT (ELECTROSURGICAL) ×2
ELECTRODE REM PT RTRN 9FT ADLT (ELECTROSURGICAL) ×1 IMPLANT
GAUZE SPONGE 4X4 12PLY STRL (GAUZE/BANDAGES/DRESSINGS) ×2 IMPLANT
GAUZE XEROFORM 1X8 LF (GAUZE/BANDAGES/DRESSINGS) IMPLANT
GLOVE BIO SURGEON STRL SZ7.5 (GLOVE) ×4 IMPLANT
GLOVE BIO SURGEON STRL SZ8 (GLOVE) ×4 IMPLANT
GLOVE BIOGEL PI IND STRL 8 (GLOVE) ×1 IMPLANT
GLOVE BIOGEL PI INDICATOR 8 (GLOVE) ×1
GLOVE INDICATOR 8.0 STRL GRN (GLOVE) ×2 IMPLANT
GOWN STRL REUS W/ TWL LRG LVL3 (GOWN DISPOSABLE) ×2 IMPLANT
GOWN STRL REUS W/ TWL XL LVL3 (GOWN DISPOSABLE) ×1 IMPLANT
GOWN STRL REUS W/TWL LRG LVL3 (GOWN DISPOSABLE) ×4
GOWN STRL REUS W/TWL XL LVL3 (GOWN DISPOSABLE) ×2
GUIDEWIRE BALL NOSE AFFIXUS (WIRE) ×2 IMPLANT
HANDLE YANKAUER SUCT BULB TIP (MISCELLANEOUS) ×2 IMPLANT
IMMOBILIZER SHDR MD LX WHT (SOFTGOODS) ×2 IMPLANT
KIT STABILIZATION SHOULDER (MISCELLANEOUS) ×2 IMPLANT
KIT TURNOVER KIT A (KITS) ×2 IMPLANT
MASK FACE SPIDER DISP (MASK) ×2 IMPLANT
MAT ABSORB  FLUID 56X50 GRAY (MISCELLANEOUS) ×2
MAT ABSORB FLUID 56X50 GRAY (MISCELLANEOUS) ×1 IMPLANT
NAIL PROX Z AFFIXUS LONG 7X200 (Nail) ×2 IMPLANT
NEEDLE HYPO 25X1 1.5 SAFETY (NEEDLE) ×2 IMPLANT
NEEDLE MAYO 6 CRC TAPER PT (NEEDLE) ×4 IMPLANT
NS IRRIG 1000ML POUR BTL (IV SOLUTION) ×2 IMPLANT
PACK SHDR ARTHRO (MISCELLANEOUS) ×2 IMPLANT
PAD CAST CTTN 4X4 STRL (SOFTGOODS) IMPLANT
PADDING CAST COTTON 4X4 STRL (SOFTGOODS)
PULSAVAC PLUS IRRIG FAN TIP (DISPOSABLE)
SCREW ANN CORT BONE 4X38 (Screw) ×2 IMPLANT
SCREW BLUNT TIP AFFIXUS 4X38 (Screw) ×4 IMPLANT
SCREW CORT AFFIXUS 4X26 (Screw) ×2 IMPLANT
SLING ARM LRG DEEP (SOFTGOODS) ×2 IMPLANT
SLING ULTRA II LG (MISCELLANEOUS) IMPLANT
STAPLER SKIN PROX 35W (STAPLE) IMPLANT
STOCKINETTE IMPERVIOUS 9X36 MD (GAUZE/BANDAGES/DRESSINGS) ×2 IMPLANT
SUT ETHIBOND 0 MO6 C/R (SUTURE) ×2 IMPLANT
SUT FIBERWIRE #2 38 BLUE 1/2 (SUTURE) ×8
SUT PROLENE 4 0 PS 2 18 (SUTURE) ×4 IMPLANT
SUT VIC AB 2-0 CT1 27 (SUTURE) ×12
SUT VIC AB 2-0 CT1 TAPERPNT 27 (SUTURE) ×6 IMPLANT
SUTURE FIBERWR #2 38 BLUE 1/2 (SUTURE) ×4 IMPLANT
SYR 10ML LL (SYRINGE) ×2 IMPLANT
TIP FAN IRRIG PULSAVAC PLUS (DISPOSABLE) IMPLANT

## 2019-07-15 NOTE — Discharge Instructions (Addendum)
Interscalene Nerve Block with Exparel  1.  For your surgery you have received an Interscalene Nerve Block with Exparel. 2. Nerve Blocks affect many types of nerves, including nerves that control movement, pain and normal sensation.  You may experience feelings such as numbness, tingling, heaviness, weakness or the inability to move your arm or the feeling or sensation that your arm has "fallen asleep". 3. A nerve block with Exparel can last up to 5 days.  Usually the weakness wears off first.  The tingling and heaviness usually wear off next.  Finally you may start to notice pain.  Keep in mind that this may occur in any order.  Once a nerve block starts to wear off it is usually completely gone within 60 minutes. 4. ISNB may cause mild shortness of breath, a hoarse voice, blurry vision, unequal pupils, or drooping of the face on the same side as the nerve block.  These symptoms will usually resolve with the numbness.  Very rarely the procedure itself can cause mild seizures. 5. If needed, your surgeon will give you a prescription for pain medication.  It will take about 60 minutes for the oral pain medication to become fully effective.  So, it is recommended that you start taking this medication before the nerve block first begins to wear off, or when you first begin to feel discomfort. 6. Take your pain medication only as prescribed.  Pain medication can cause sedation and decrease your breathing if you take more than you need for the level of pain that you have. 7. Nausea is a common side effect of many pain medications.  You may want to eat something before taking your pain medicine to prevent nausea. 8. After an Interscalene nerve block, you cannot feel pain, pressure or extremes in temperature in the effected arm.  Because your arm is numb it is at an increased risk for injury.  To decrease the possibility of injury, please practice the following:  a. While you are awake change the position of  your arm frequently to prevent too much pressure on any one area for prolonged periods of time. b.  If you have a cast or tight dressing, check the color or your fingers every couple of hours.  Call your surgeon with the appearance of any discoloration (white or blue). c. If you are given a sling to wear before you go home, please wear it  at all times until the block has completely worn off.  Do not get up at night without your sling. d. Please contact [facility] or your surgeon if you do not begin to regain sensation after 7 days from the surgery.  Anesthesia may be contacted by calling the Same Day Surgery Department, Mon. through Fri., 6 am to 4 pm at [phone].   e. If you experience any other problems or concerns, please contact your surgeon's office. f. If you experience severe or prolonged shortness of breath go to the nearest emergency department.     Orthopedic discharge instructions: May shower with intact op-site dressings.  Apply ice frequently to shoulder. Take ibuprofen 600-800 mg TID with meals for 7-10 days, then as necessary.   TID = 3 times per day (every 8 hours) Take oxycodone as prescribed when needed.  May supplement with ES Tylenol if necessary. Keep shoulder immobilizer on at all times except may remove for bathing purposes. Follow-up in 10-14 days or as scheduled.   AMBULATORY SURGERY  DISCHARGE INSTRUCTIONS   1) The drugs  that you were given will stay in your system until tomorrow so for the next 24 hours you should not:  A) Drive an automobile B) Make any legal decisions C) Drink any alcoholic beverage   2) You may resume regular meals tomorrow.  Today it is better to start with liquids and gradually work up to solid foods.  You may eat anything you prefer, but it is better to start with liquids, then soup and crackers, and gradually work up to solid foods.   3) Please notify your doctor immediately if you have any unusual bleeding, trouble  breathing, redness and pain at the surgery site, drainage, fever, or pain not relieved by medication.    4) Additional Instructions:        Please contact your physician with any problems or Same Day Surgery at [phone], Monday through Friday 6 am to 4 pm, or [place] at [facility] number at [phone].

## 2019-07-15 NOTE — OR Nursing (Signed)
Dr. Joice Lofts in to see pt postop 425 pm.

## 2019-07-15 NOTE — Op Note (Signed)
04/07/2018  1:43 PM  Patient:   Taylor Odom  Pre-Op Diagnosis:   Closed displaced humeral shaft fracture, left shoulder.  Post-Op Diagnosis:   Same  Procedure:   Intramedullary nailing of displaced humeral shaft fracture, left shoulder.  Surgeon:   Maryagnes Amos, MD  Assistant:   Horris Latino, PA-C; Nyra Jabs, PA-S  Anesthesia:   General endotracheal with interscalene block using Exparel placed preoperatively by the anesthesiologist.  Findings:   As above.  Complications:   None  EBL:   25 cc  Fluids:   1000 cc crystalloid  TT:   None  Drains:   None  Closure:   Staples  Implants:   Biomet Affyxis 7 x 200 mm humerus locking nail  Brief Clinical Note:   The patient is a 44 year old female who sustained the above-noted injury 2 days ago when she apparently tripped and fell at her home.  She was brought to the emergency room where x-rays demonstrated the above-noted injury.  She presents at this time for definitive management of this injury.  Procedure:   The patient underwent placement of an interscalene block using Exparel in the preoperative holding area by anesthesia before being brought into the operating room and lain in the supine position. After adequate general endotracheal intubation and anesthesia were obtained, the patient was repositioned in the beach chair position using the beach chair positioner. The left shoulder and upper extremity were prepped with ChloraPrep solution before being draped sterilely, incorporating the Spider arm holder. Preoperative antibiotics were administered. After performing a timeout to verify the appropriate surgical site, an anterolateral approach to the shoulder was made through an approximately 4-5 cm incision. The incision was carried down through the subcutaneous tissues to expose the deltoid fascia. The raphae was identified and developed to provide access into the subacromial space. The rotator cuff was inspected and found to be  intact.   Under C-arm visualization, a guidewire was positioned at the lateral margin of the articular surface adjacent to the greater tuberosity and advanced into the humeral head in line with the shaft. After its position was verified fluoroscopically in several projections, the guidewire was overreamed with a 9 mm reamer to create the entrance hole. A beaded guidewire was advanced down the proximal humerus. After several attempts, the guidewire was passed across the fracture site to enter the distal portion of the humeral shaft and advanced distally to the olecranon fossa. The guidewire was measured and found to be 235 mm. Therefore, the 200 mm nail was selected. The guidewire was over-reamed with first an 8 mm and then a 9 mm reamer. The 7 x 200 mm nail was attached to the appropriate proximal guide system and advanced to reside a few millimeters below the lateral humeral head cortex as verified fluoroscopically.  Three blunt screws of appropriate length were placed into the proximal humerus using the anterolateral, lateral, and posterolateral positions on the guide. One of these screws was placed through a short stab incision while the other two were placed through the initial surgical incision. The appropriate guide sleeves were used to minimize trauma to the surrounding soft tissues. The proximal locking mechanism was engaged to secure the screws into place in the proximal aspect of the humeral nail.  Attention was directed to the distal aspect of the upper arm. The arm was positioned to maintain neutral rotation and, using the perfect circle technique under fluoroscopic guidance, one bicortical screw of appropriate length (26 mm) was placed through a  short stab incision in an anterior to posterior direction across the distal humerus shaft to engage the distal aspect of the humeral nail.  Prior to screw placement, the subcutaneous tissues were bluntly dissected down to the shaft in order to minimize  soft tissue trauma. The adequacy of screw position proximally and distally, as well as the overall fracture alignment, was verified fluoroscopically in AP and lateral projections and found to be excellent. In addition, the humeral head was rotated under fluoroscopic visualization to be sure that no screws penetrated the humeral head.  The wounds were copiously irrigated with sterile saline solution using bulb irrigation before the subcutaneous tissues were closed using 2-0 Vicryl interrupted sutures. The skin was closed using staples. A total of 30 cc of 0.5% Sensorcaine with epinephrine was injected in and around each of the incision sites to help with postoperative analgesia.Sterile occlusive dressings were applied to wounds before the arm was placed into a shoulder immobilizer. A Polar Care also was applied before the patient was awakened, extubated, and returned to the recovery room in satisfactory condition after tolerating the procedure well.

## 2019-07-15 NOTE — Anesthesia Preprocedure Evaluation (Addendum)
Anesthesia Evaluation  Patient identified by MRN, date of birth, ID band Patient awake    Reviewed: Allergy & Precautions, H&P , NPO status , Patient's Chart, lab work & pertinent test results  Airway Mallampati: I  TM Distance: >3 FB Neck ROM: full    Dental  (+) Teeth Intact   Pulmonary neg COPD, former smoker,    breath sounds clear to auscultation       Cardiovascular (-) angina(-) Past MI negative cardio ROS  (-) dysrhythmias  Rhythm:regular Rate:Normal     Neuro/Psych  Headaches, PSYCHIATRIC DISORDERS Anxiety    GI/Hepatic negative GI ROS, Neg liver ROS,   Endo/Other  negative endocrine ROS  Renal/GU      Musculoskeletal   Abdominal   Peds  Hematology negative hematology ROS (+)   Anesthesia Other Findings Past Medical History: No date: Anxiety No date: Left chest pressure     Comment:  a. Intermittent throughout her life-->increased freq and              duration beginning 01/2015. No date: Migraine aura without headache No date: PTSD (post-traumatic stress disorder) No date: Restless leg No date: Sinus tachycardia     Comment:  a. Says she is frequently told that her HR is elevated.  History reviewed. No pertinent surgical history.  BMI    Body Mass Index: 28.46 kg/m      Reproductive/Obstetrics negative OB ROS                           Anesthesia Physical Anesthesia Plan  ASA: II  Anesthesia Plan: General ETT   Post-op Pain Management: GA combined w/ Regional for post-op pain   Induction:   PONV Risk Score and Plan: Ondansetron, Dexamethasone, Midazolam and Treatment may vary due to age or medical condition  Airway Management Planned:   Additional Equipment:   Intra-op Plan:   Post-operative Plan:   Informed Consent: I have reviewed the patients History and Physical, chart, labs and discussed the procedure including the risks, benefits and alternatives for  the proposed anesthesia with the patient or authorized representative who has indicated his/her understanding and acceptance.     Dental Advisory Given  Plan Discussed with: Anesthesiologist, CRNA and Surgeon  Anesthesia Plan Comments:        Anesthesia Quick Evaluation

## 2019-07-15 NOTE — Transfer of Care (Signed)
Immediate Anesthesia Transfer of Care Note  Patient: Taylor Odom  Procedure(s) Performed: OPEN REDUCTION INTERNAL FIXATION (ORIF) PROXIMAL HUMERUS FRACTURE (Left Shoulder)  Patient Location: PACU  Anesthesia Type:General  Level of Consciousness: sedated  Airway & Oxygen Therapy: Patient Spontanous Breathing and Patient connected to face mask oxygen  Post-op Assessment: Report given to RN and Post -op Vital signs reviewed and stable  Post vital signs: Reviewed and stable  Last Vitals:  Vitals Value Taken Time  BP 118/81 07/15/19 1416  Temp 36.8 C 07/15/19 1401  Pulse 115 07/15/19 1424  Resp 14 07/15/19 1424  SpO2 99 % 07/15/19 1424  Vitals shown include unvalidated device data.  Last Pain:  Vitals:   07/15/19 1401  TempSrc:   PainSc: Asleep         Complications: No complications documented.

## 2019-07-15 NOTE — Anesthesia Procedure Notes (Signed)
Anesthesia Regional Block: Interscalene brachial plexus block   Pre-Anesthetic Checklist: ,, timeout performed, Correct Patient, Correct Site, Correct Laterality, Correct Procedure, Correct Position, site marked, Risks and benefits discussed,  Surgical consent,  Pre-op evaluation,  At surgeon's request and post-op pain management  Laterality: Left  Prep: chloraprep       Needles:  Injection technique: Single-shot  Needle Type: Stimiplex     Needle Length: 9cm  Needle Gauge: 20     Additional Needles:   Procedures:,,,, ultrasound used (permanent image in chart),,,,  Narrative:  Start time: 07/15/2019 9:29 AM End time: 07/15/2019 9:32 AM  Performed by: Personally  Anesthesiologist: Karleen Hampshire, MD  Additional Notes: Risks and benefits of nerve block discussed with patient, including but not limited to risk of nerve injury, bleeding, infection, and failed block.  Patient expressed understanding and consented to block placement.   Functioning IV was confirmed and monitors were applied.  Sterile prep,hand hygiene and sterile gloves were used.  Minimal sedation used for procedure.  During the procedure, there was negative aspiration, negative paresthesia on injection, and dose was given in divided aliquots under ultrasound guidance.  Patient tolerated the procedure well with no immediate complications.

## 2019-07-15 NOTE — Anesthesia Procedure Notes (Signed)
Procedure Name: Intubation Date/Time: 07/15/2019 11:42 AM Performed by: Almeta Monas, CRNA Pre-anesthesia Checklist: Patient identified, Patient being monitored, Timeout performed, Emergency Drugs available and Suction available Patient Re-evaluated:Patient Re-evaluated prior to induction Oxygen Delivery Method: Circle system utilized Preoxygenation: Pre-oxygenation with 100% oxygen Induction Type: IV induction Ventilation: Mask ventilation without difficulty Laryngoscope Size: 3 and McGraph Grade View: Grade I Tube type: Oral Tube size: 7.0 mm Number of attempts: 1 Airway Equipment and Method: Stylet,  Video-laryngoscopy and Rigid stylet Placement Confirmation: ETT inserted through vocal cords under direct vision,  positive ETCO2 and breath sounds checked- equal and bilateral Secured at: 21 cm Tube secured with: Tape Dental Injury: Teeth and Oropharynx as per pre-operative assessment

## 2019-07-15 NOTE — H&P (Signed)
History of Present Illness: Taylor Odom is a 44 y.o. female who presents today for evaluation of a left shoulder injury sustained on 07/13/2019. The patient was taking trash out when she tripped and fell. She believes that she reached out with the left upper extremity and landed directly onto her left arm. The patient had immediate pain and discomfort to the left upper extremity and went to the emergency room. In the emergency room x-rays of the left shoulder demonstrate a fracture through the proximal shaft the left humerus in addition to a fracture to the greater tuberosity. No subluxation or dislocation. She presents today with significant discomfort in left upper extremity which she ranks as a 9 out of 10. She denies any previous injury or trauma prior to her fall last night to left shoulder. She denies any numbness or tingling to the left upper extremity. She denies any pain in the left wrist. Denies any open wound to the left upper extremity. She works at [employer] and throat. The patient denies any surgical history to the left upper extremity. She is right-hand dominant. She denies any personal history of heart attack, stroke, asthma or COPD.  Past Medical History: . Anxiety  . Chickenpox  . Left chest pressure  Intermittent throughout her life-->increased freq and duration beginning 01/2015  . Migraine aura without headache  . PTSD (post-traumatic stress disorder)  . Restless leg   Past Surgical History: . Wisdom Teeth Removed   Past Family History: . Hypothyroidism Mother  . Hyperlipidemia (Elevated cholesterol) Mother  . High blood pressure (Hypertension) Father  . Lung cancer Father   Medications: . ALPRAZolam (XANAX) 0.5 MG tablet Take 1 tablet by mouth as needed  . ondansetron (ZOFRAN-ODT) 4 MG disintegrating tablet Allow 1-2 tablets to dissolve in your mouth every 8 hours as needed for nausea/vomiting  . oxyCODONE-acetaminophen (PERCOCET) 5-325 mg tablet Take by mouth Take 2  tablets by mouth every 6 (six) hours as needed for severe pain.  Marland Kitchen sulfacetamide (KLARON) 10 % topical suspension as needed   No current Epic-ordered facility-administered medications on file.   Allergies: No Known Allergies   Review of Systems:  A comprehensive 14 point ROS was performed, reviewed by me today, and the pertinent orthopaedic findings are documented in the HPI.  Physical Exam: BP 136/84  Ht 162.6 cm (5\' 4" )  Wt 78.8 kg (173 lb 12.8 oz)  BMI 29.83 kg/m  General/Constitutional: The patient appears to be well-nourished, well-developed, and in no acute distress. Neuro/Psych: Normal mood and affect, oriented to person, place and time. Eyes: Non-icteric. Pupils are equal, round, and reactive to light, and exhibit synchronous movement. ENT: Unremarkable. Lymphatic: No palpable adenopathy. Respiratory: Lungs clear to auscultation, Normal chest excursion, No wheezes and Non-labored breathing Cardiovascular: Regular rate and rhythm. No murmurs. and No edema, swelling or tenderness, except as noted in detailed exam. Integumentary: No impressive skin lesions present, except as noted in detailed exam. Musculoskeletal: Unremarkable, except as noted in detailed exam.  The patient presents today wearing a shoulder sling to the left upper extremity. Skin examination of the left arm demonstrates no open wound or erythema. Moderate swelling to the left upper extremity without significant ecchymosis present just yet. The patient is moderately tender to palpation to the left shoulder. No significant discomfort with palpation to the left elbow or left wrist. She is able to flex and extend left wrist without discomfort, she is able tolerate gentle flexion extension of the left elbow. She is able to  flex and extend all digits. Cap refills intact individual digit. She is intact light touch on the left upper extremity. Several Ace wraps were applied to the left upper extremity to swaddled the left  arm up against her body.  Imaging: X-rays obtained in the emergency room are detailed in the HPI above.  Impression: Closed displaced fracture of proximal end of left humerus.  Plan:  1. Treatment options were discussed today with the patient. 2. The patient was instructed on both surgical and nonsurgical options. After discussion of the risk and benefits the patient would like to proceed with surgical intervention. 3. This patient will be scheduled for a internal reduction and fixation of the left proximal humerus fracture. The surgery will be performed by Dr. Joice Lofts tomorrow, 07/15/2019. 4. The risk and benefits of the procedure were discussed today with the patient. This document will serve as a surgical history and physical for the patient. 5. The patient will follow-up per standard post-op protocol. They can call the clinic they have any questions, new symptoms develop or symptoms worsen.  The procedure was discussed with the patient, as were the potential risks (including bleeding, infection, nerve and/or blood vessel injury, persistent or recurrent pain, failure of the hardware, non-union, progression of arthritis, need for further surgery, blood clots, strokes, heart attacks and/or arhythmias, pneumonia, etc.) and benefits. The patient states her understanding and wishes to proceed.   H&P reviewed and patient re-examined. No changes.

## 2019-07-16 ENCOUNTER — Encounter: Payer: Self-pay | Admitting: Surgery

## 2019-07-22 NOTE — Anesthesia Postprocedure Evaluation (Signed)
Anesthesia Post Note  Patient: Taylor Odom  Procedure(s) Performed: OPEN REDUCTION INTERNAL FIXATION (ORIF) PROXIMAL HUMERUS FRACTURE (Left Shoulder)  Patient location during evaluation: PACU Anesthesia Type: General Level of consciousness: awake and alert Pain management: pain level controlled Vital Signs Assessment: post-procedure vital signs reviewed and stable Respiratory status: spontaneous breathing, nonlabored ventilation, respiratory function stable and patient connected to nasal cannula oxygen Cardiovascular status: blood pressure returned to baseline and stable Postop Assessment: no apparent nausea or vomiting Anesthetic complications: no   No complications documented.   Last Vitals:  Vitals:   07/15/19 1553 07/15/19 1626  BP: (!) 142/103 (!) 129/94  Pulse: (!) 111 (!) 113  Resp: 18 16  Temp:    SpO2: 99% 98%    Last Pain:  Vitals:   07/16/19 0829  TempSrc:   PainSc: 9                  Yevette Edwards

## 2020-05-09 ENCOUNTER — Encounter: Payer: Self-pay | Admitting: Cardiovascular Disease

## 2020-05-09 ENCOUNTER — Other Ambulatory Visit: Payer: Self-pay

## 2020-05-09 ENCOUNTER — Ambulatory Visit (INDEPENDENT_AMBULATORY_CARE_PROVIDER_SITE_OTHER): Payer: BLUE CROSS/BLUE SHIELD | Admitting: Cardiovascular Disease

## 2020-05-09 VITALS — BP 110/90 | HR 97 | Ht 64.0 in | Wt 174.5 lb

## 2020-05-09 DIAGNOSIS — R0602 Shortness of breath: Secondary | ICD-10-CM | POA: Diagnosis not present

## 2020-05-09 DIAGNOSIS — R000 Tachycardia, unspecified: Secondary | ICD-10-CM | POA: Diagnosis not present

## 2020-05-09 DIAGNOSIS — R0789 Other chest pain: Secondary | ICD-10-CM

## 2020-05-09 NOTE — Patient Instructions (Addendum)
Medication Instructions:  No changes  If you need a refill on your cardiac medications before your next appointment, please call your pharmacy.    Lab work: No new labs needed  Testing/Procedures: Echo  (shortness of breath, tachycardia)  (You will see you results in your MyChart the same time the doctor receives the results, they need time to review results and make recommendations, you can expect a call a few days after your test)  Your physician has requested that you have an echocardiogram. Echocardiography is a painless test that uses sound waves to create images of your heart. It provides your doctor with information about the size and shape of your heart and how well your heart's chambers and valves are working. This procedure takes approximately one hour. There are no restrictions for this procedure.  There is a possibility that an IV may need to be started during your test to inject an image enhancing agent. This is done to obtain more optimal pictures of your heart. Therefore we ask that you do at least drink some water prior to coming in to hydrate your veins.     Follow-Up: At [facility], you and your health needs are our priority.  As part of our continuing mission to provide you with exceptional heart care, we have created designated Provider Care Teams.  These Care Teams include your primary Cardiologist (physician) and Advanced Practice Providers (APPs -  Physician Assistants and Nurse Practitioners) who all work together to provide you with the care you need, when you need it.  . You will need a follow up appointment as needed  Call the clinic when you would like to be seen  [phone]  . Providers on your designated Care Team:   . Nicolasa Ducking, NP . Eula Listen, PA-C . Marisue Ivan, PA-C  COVID-19 Vaccine Information can be found at: [email or web address] For questions related to vaccine  distribution or appointments, please email [email or web address] or call [phone].

## 2020-05-09 NOTE — Progress Notes (Signed)
Patient ID: Taylor Odom, female   DOB: December 31, 1975, 45 y.o.   MRN: [id number] Cardiology Office Note  Date:  05/09/2020   ID:  Taylor Odom, DOB 1976-01-11, MRN [id number]  PCP:  Ellyn Hack, MD   Chief Complaint  Patient presents with  . New Patient (Initial Visit)    Patient c/o chest tightness, tachycardia and elevated blood pressure with mild headaches most days. Medications reviewed by the patient verbally.     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 Resting heart rate prior to the study was 100 bpm, resting heart rate after 3 minutes in recovery was 123 bpm. Appropriate heart rate response, peak heart rate 180.  family history,  father is a smoker, has COPD a 28, mother has thyroid disease no coronary disease in the family, no siblings    PMH:   has a past medical history of Anxiety, Left chest pressure, Migraine aura without headache, PTSD (post-traumatic stress disorder), Restless leg, and Sinus tachycardia.  PSH:    Past Surgical History:  Procedure Laterality Date  . ORIF HUMERUS FRACTURE Left 07/15/2019   Procedure: OPEN REDUCTION  INTERNAL FIXATION (ORIF) PROXIMAL HUMERUS FRACTURE;  Surgeon: Christena Flake, MD;  Location: [facility] ORS;  Service: Orthopedics;  Laterality: Left;    Current Outpatient Medications  Medication Sig Dispense Refill  . ALPRAZolam (XANAX) 0.5 MG tablet Take 1 tablet (0.5 mg total) by mouth 2 (two) times daily as needed. 60 tablet 2  . Calcium Carbonate-Vitamin D (CALTRATE 600+D PO) Take by mouth daily.    . cholecalciferol (VITAMIN D3) 25 MCG (1000 UNIT) tablet Take 1,000 Units by mouth daily.    Marland Kitchen oxyCODONE (ROXICODONE) 5 MG immediate release tablet Take 1-2 tablets (5-10 mg total) by mouth every 4 (four) hours as needed for moderate pain or severe pain. (Patient not taking: Reported on 05/09/2020) 50 tablet 0   No current facility-administered medications for this visit.     Allergies:   Amoxicillin-pot clavulanate   Social History:  The patient  reports that she has quit smoking. Her smoking use included cigarettes. She has never used smokeless tobacco. She reports current alcohol use. She reports that she does not use drugs.   Family History:   family history includes COPD in her father; Heart attack in her cousin; Hypertension in her father; Hypothyroidism in her mother.    Review of Systems: Review of Systems  Constitutional: Negative.   Respiratory: Negative.   Cardiovascular: Negative.        Occasional left chest fullness  Gastrointestinal: Negative.   Musculoskeletal: Negative.   Neurological: Negative.   Psychiatric/Behavioral: Negative.   All other systems reviewed and are negative.    PHYSICAL EXAM: VS:  BP 110/90 (BP Location: Right Arm, Patient Position: Sitting, Cuff Size: Normal)   Pulse 97   Ht 5\' 4"  (1.626 m)   Wt 174 lb 8 oz (79.2 kg)   SpO2 98%   BMI 29.95 kg/m  , BMI Body mass index is 29.95 kg/m. GEN: Well nourished, well developed, in no acute distress  HEENT: normal  Neck: no JVD, carotid bruits, or masses Cardiac: RRR; no murmurs, rubs, or gallops,no  edema , tachycardia Respiratory:  clear to auscultation bilaterally, normal work of breathing GI: soft, nontender, nondistended, + BS MS: no deformity or atrophy  Skin: warm and dry, no rash Neuro:  Strength and sensation are intact Psych: euthymic mood, full affect   Recent Labs: No results found for requested labs within last 8760 hours.    Lipid Panel No results found for: CHOL, HDL, LDLCALC, TRIG    Wt Readings from Last 3 Encounters:  05/09/20 174 lb 8 oz (79.2 kg)  07/15/19 171 lb (77.6 kg)  07/13/19 178 lb (80.7 kg)       ASSESSMENT AND PLAN:  Sinus tachycardia (HCC) -  Likely exacerbated by work stress For inappropriate tachycardia, ZIO monitor could be ordered Has tried Bystolic in the past Potentially could also try propranolol as needed She will call 07/15/19 if interested  Chest pressure Atypical features, recommended stress reduction techniques Echocardiogram ordered for further evaluation   Total encounter time more than 25 minutes  Greater than 50% was spent in counseling and coordination of care with the patient   No orders of the defined types were placed in this encounter.    Total encounter time more than 25 minutes  Greater than 50% was spent in counseling and coordination of care with the patient   Signed, [place], M.D., Ph.D. 05/09/2020  [facility] [place], [place]

## 2020-05-16 ENCOUNTER — Ambulatory Visit (INDEPENDENT_AMBULATORY_CARE_PROVIDER_SITE_OTHER): Payer: BLUE CROSS/BLUE SHIELD

## 2020-05-16 ENCOUNTER — Other Ambulatory Visit: Payer: Self-pay

## 2020-05-16 DIAGNOSIS — R0789 Other chest pain: Secondary | ICD-10-CM

## 2020-05-16 DIAGNOSIS — R000 Tachycardia, unspecified: Secondary | ICD-10-CM

## 2020-05-16 DIAGNOSIS — R0602 Shortness of breath: Secondary | ICD-10-CM | POA: Diagnosis not present

## 2020-05-17 LAB — ECHOCARDIOGRAM COMPLETE
AR max vel: 2.85 cm2
AV Area VTI: 2.58 cm2
AV Area mean vel: 2.57 cm2
AV Mean grad: 4 mmHg
AV Peak grad: 7.8 mmHg
Ao pk vel: 1.4 m/s
Area-P 1/2: 5.7 cm2
S' Lateral: 2.3 cm
Single Plane A4C EF: 63 %

## 2020-05-18 ENCOUNTER — Telehealth: Payer: Self-pay

## 2020-05-18 NOTE — Telephone Encounter (Signed)
Able to reach pt regarding her recent echo, Dr. Mariah Milling had a chance to review her results and advised   "Echocardiogram  Normal LV function, normal RV function, overall good study"   Taylor Odom is thankful to for the phone call the her echo results, her questions were answer, no additional concerns at this time.

## 2021-02-06 IMAGING — XA DG C-ARM 1-60 MIN
7 series · 7 of 7 positions shown · non-contrast
Comparison: None.

CLINICAL DATA: Elective surgery

EXAM:
LEFT HUMERUS - 2+ VIEW; DG C-ARM 1-60 MIN

[Series 12: ortho standard · 1 of 1 slices shown (1 of 7)]
[im 1/1]
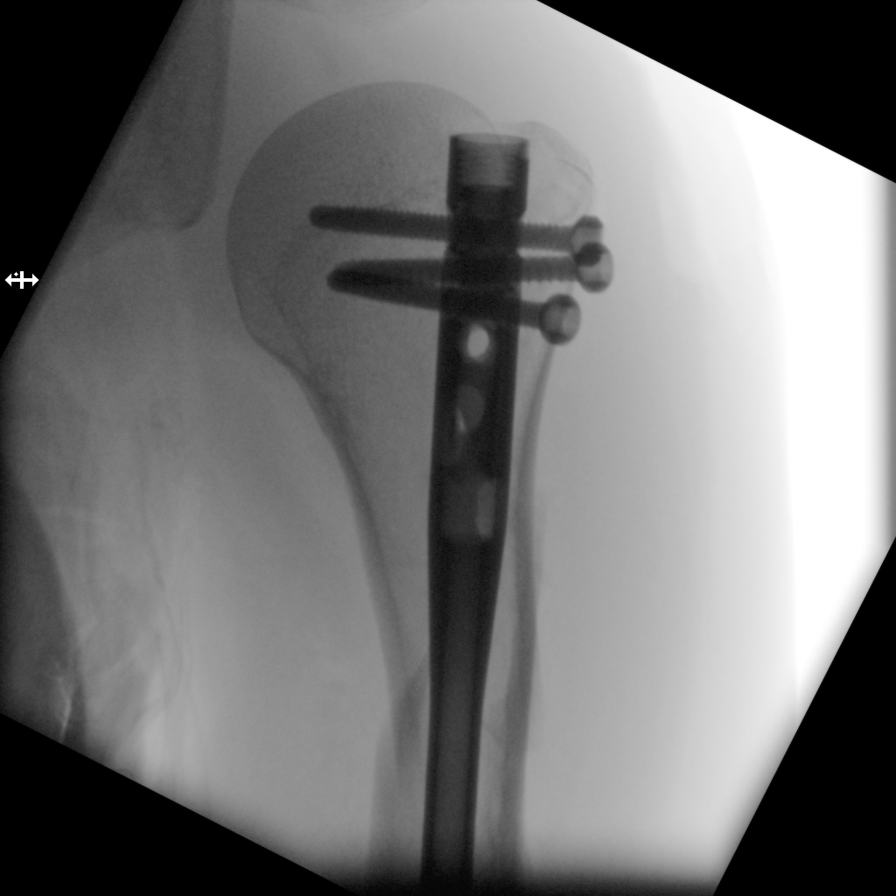

[Series 13: ortho standard · 1 of 1 slices shown (2 of 7)]
[im 1/1]
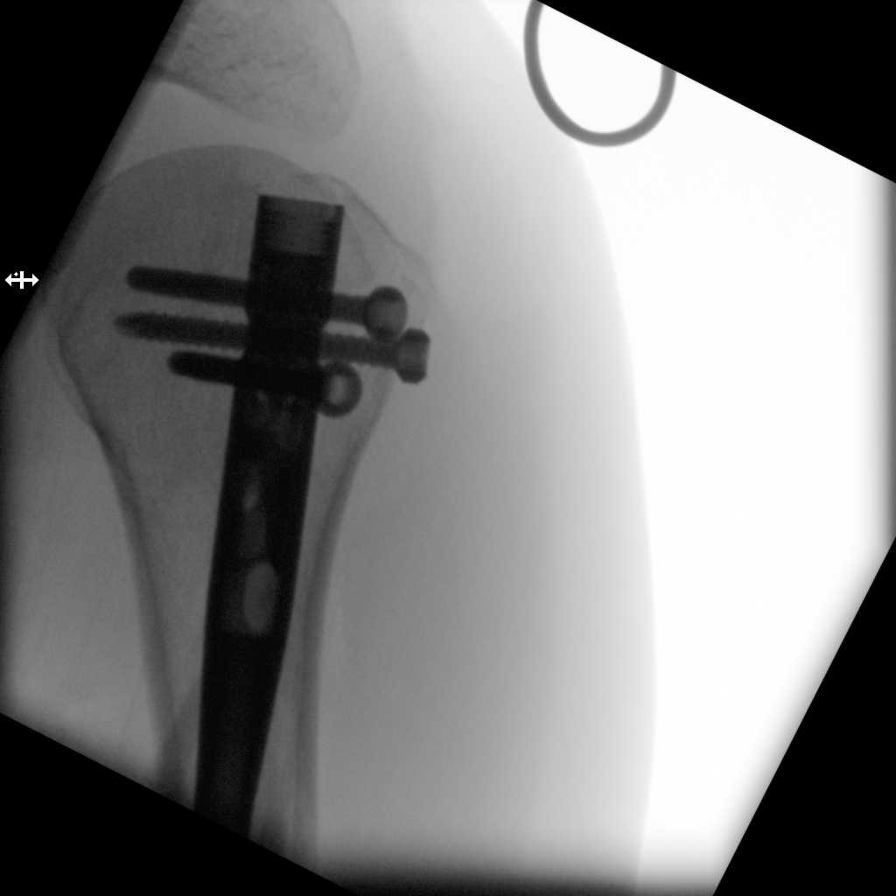

[Series 14: ortho standard · 1 of 1 slices shown (3 of 7)]
[im 1/1]
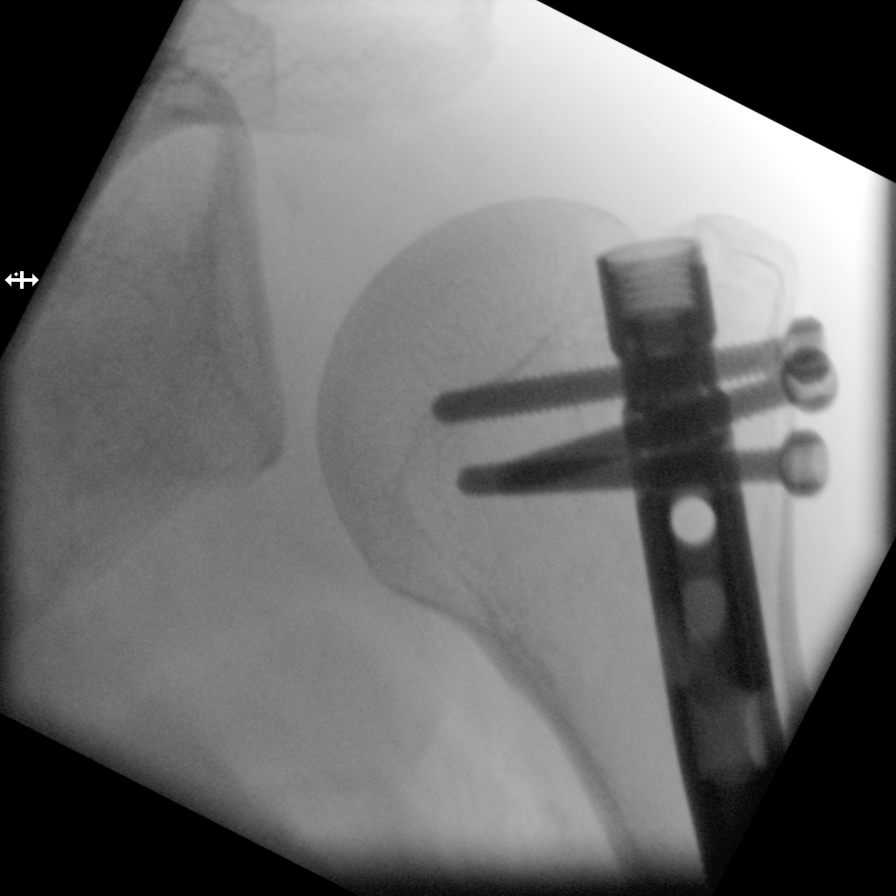

[Series 15: ortho standard · 1 of 1 slices shown (4 of 7)]
[im 1/1]
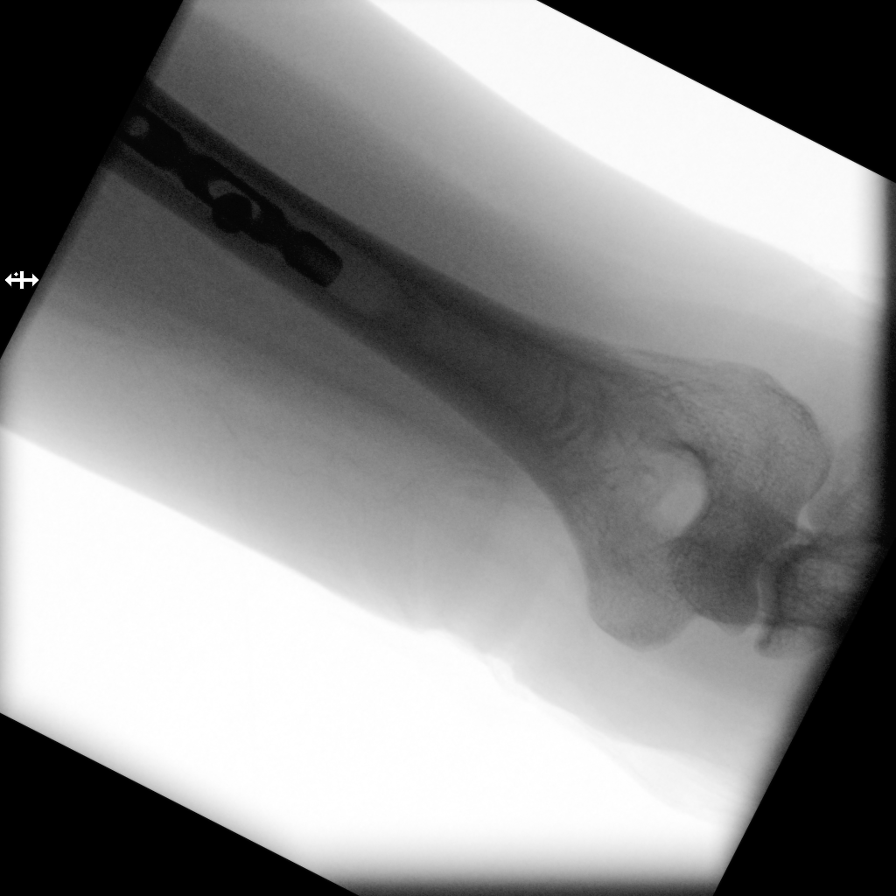

[Series 16: ortho standard · 1 of 1 slices shown (5 of 7)]
[im 1/1]
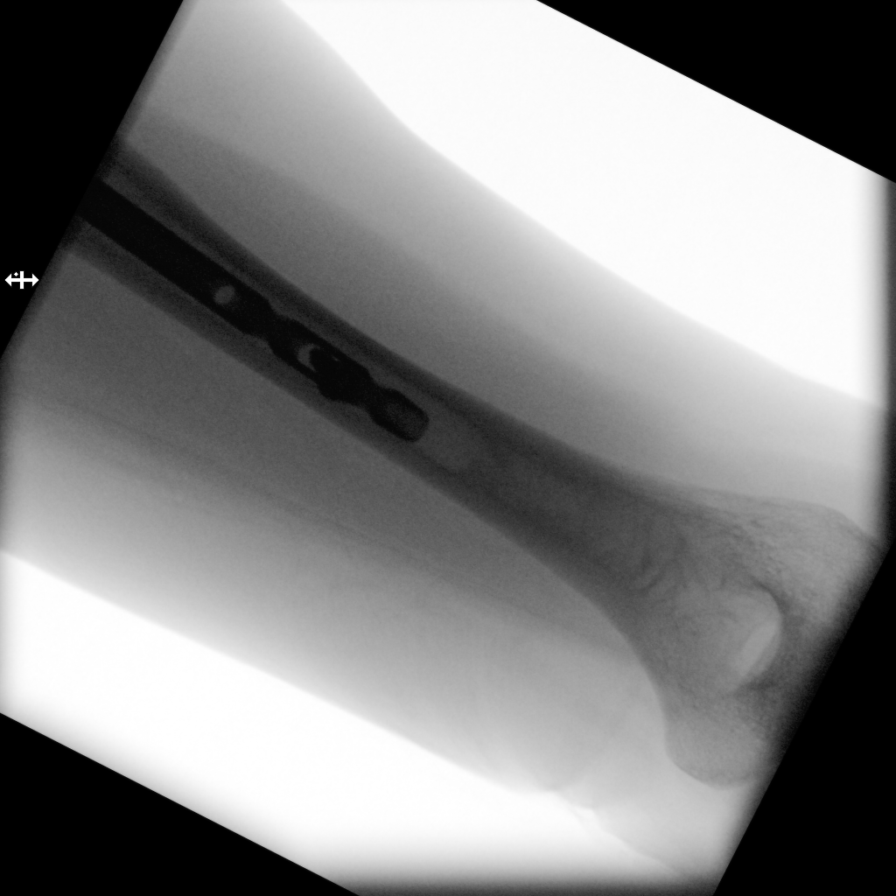

[Series 17: ortho standard · 1 of 1 slices shown (6 of 7)]
[im 1/1]
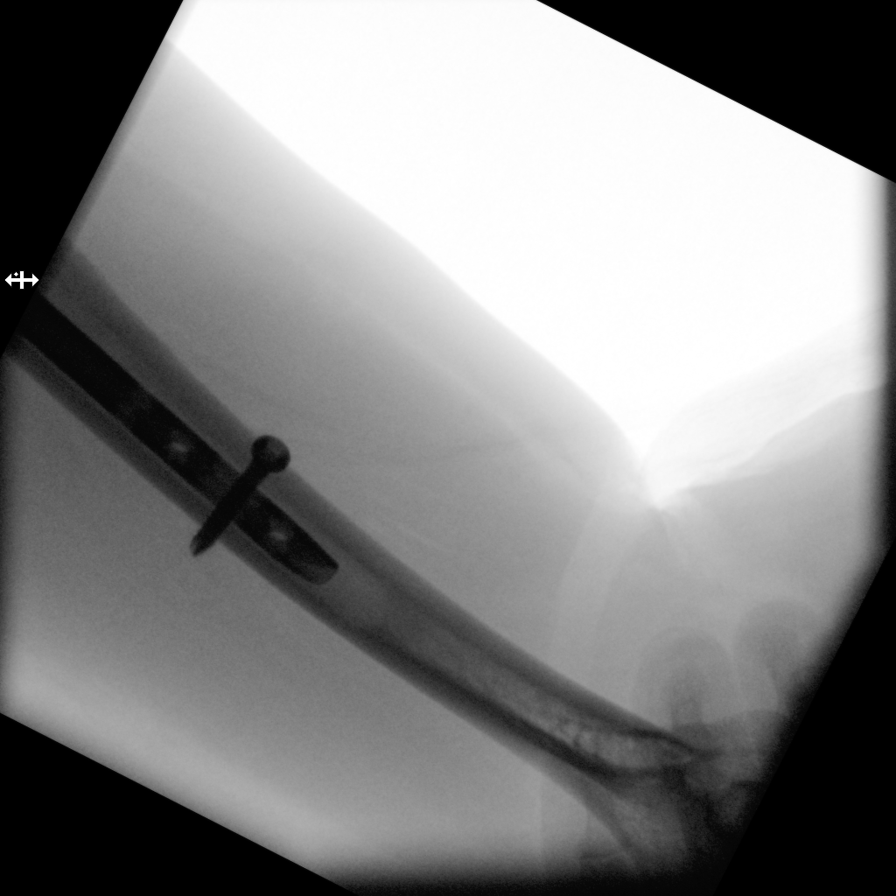

[Series 18: ortho standard · 1 of 1 slices shown (7 of 7)]
[im 1/1]
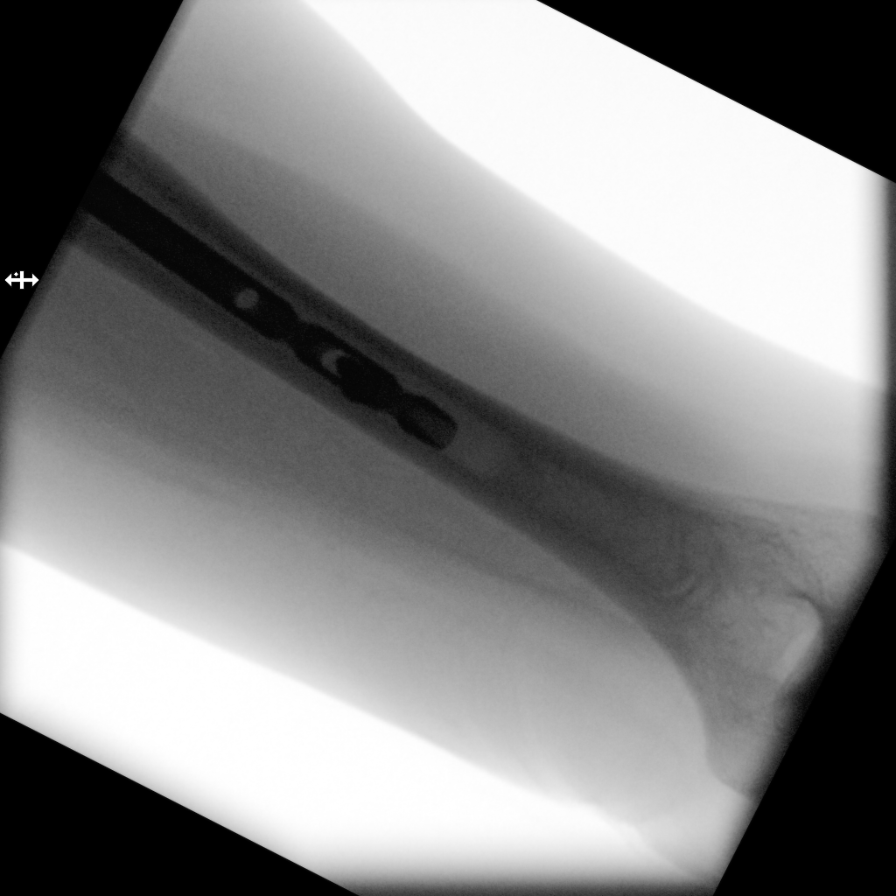

[7 of 7 positions shown; findings below may reference images not displayed]

FINDINGS: Internal fixation across the proximal humeral shaft fracture. No
hardware complicating feature.
IMPRESSION: Internal fixation.  No visible complicating feature.
